# Patient Record
Sex: Male | Born: 1995
Health system: Southern US, Community
[De-identification: ages and names within clinical notes are randomized; demographics above are authoritative.]

## PROBLEM LIST (undated history)

## (undated) DIAGNOSIS — T7840XA Allergy, unspecified, initial encounter: Secondary | ICD-10-CM

## (undated) HISTORY — DX: Allergy, unspecified, initial encounter: T78.40XA

---
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
## 2014-03-30 ENCOUNTER — Ambulatory Visit: Payer: BC Managed Care – PPO | Admitting: Family Medicine

## 2014-04-08 ENCOUNTER — Ambulatory Visit (INDEPENDENT_AMBULATORY_CARE_PROVIDER_SITE_OTHER): Payer: BLUE CROSS/BLUE SHIELD | Admitting: Family Medicine

## 2014-04-08 ENCOUNTER — Encounter: Payer: Self-pay | Admitting: Family Medicine

## 2014-04-08 VITALS — BP 117/67 | HR 52 | Temp 97.6°F | Ht 70.5 in | Wt 163.5 lb

## 2014-04-08 DIAGNOSIS — J309 Allergic rhinitis, unspecified: Secondary | ICD-10-CM

## 2014-04-08 DIAGNOSIS — Z0000 Encounter for general adult medical examination without abnormal findings: Secondary | ICD-10-CM | POA: Insufficient documentation

## 2014-04-08 DIAGNOSIS — Z113 Encounter for screening for infections with a predominantly sexual mode of transmission: Secondary | ICD-10-CM

## 2014-04-08 LAB — LIPID PANEL
CHOL/HDL RATIO: 4
Cholesterol: 164 mg/dL (ref 0–200)
HDL: 45.8 mg/dL (ref >39.00)
LDL CALC: 96 mg/dL (ref 0–99)
NonHDL: 118.2
TRIGLYCERIDES: 109 mg/dL (ref 0.0–149.0)
VLDL: 21.8 mg/dL (ref 0.0–40.0)

## 2014-04-08 LAB — COMPREHENSIVE METABOLIC PANEL
ALBUMIN: 4.5 g/dL (ref 3.5–5.2)
ALK PHOS: 33 U/L — AB (ref 39–117)
ALT: 11 U/L (ref 0–53)
AST: 16 U/L (ref 0–37)
BILIRUBIN TOTAL: 0.5 mg/dL (ref 0.2–1.2)
BUN: 11 mg/dL (ref 6–23)
CO2: 31 meq/L (ref 19–32)
Calcium: 9.9 mg/dL (ref 8.4–10.5)
Chloride: 102 mEq/L (ref 96–112)
Creatinine, Ser: 0.97 mg/dL (ref 0.40–1.50)
GFR: 126.89 mL/min (ref >60.00)
GLUCOSE: 85 mg/dL (ref 70–99)
POTASSIUM: 3.7 meq/L (ref 3.5–5.1)
SODIUM: 138 meq/L (ref 135–145)
TOTAL PROTEIN: 7.6 g/dL (ref 6.0–8.3)

## 2014-04-08 NOTE — Progress Notes (Signed)
   Subjective:   Patient ID: Lance Mann, male    DOB: 1995/10/09, [age] y.o.   MRN: [id number]  Lance PrudeCaleb J Asato is a pleasant 19 y.o. year old male who presents to clinic today with Establish Care  on 04/08/2014  HPI:  Allergic rhinitis- mainly seasonal.  Takes prn zyrtec and or allegra. Currently asymptomatic.  Denies any respiratory complaints.  Was attending [employer] but now plans on going to the [employer]. Taking his entrance exam next month.  Physically active.  Exercises with his brother who is a Systems analystpersonal trainer.  Sexually active, but not currently.  Admits to not always using condoms. Denies any dysuria or penile discharge.  No current outpatient prescriptions on file prior to visit.   No current facility-administered medications on file prior to visit.    No Known Allergies  Past Medical History  Diagnosis Date  . Allergy     History reviewed. No pertinent past surgical history.  Family History  Problem Relation Age of Onset  . Hypertension Father     History   Social History  . Marital Status: Single    Spouse Name: N/A  . Number of Children: N/A  . Years of Education: N/A   Occupational History  . Not on file.   Social History Main Topics  . Smoking status: Never Smoker   . Smokeless tobacco: Never Used  . Alcohol Use: No  . Drug Use: No  . Sexual Activity: Not on file   Other Topics Concern  . Not on file   Social History Narrative   The PMH, PSH, Social History, Family History, Medications, and allergies [facility], and have been updated if relevant.   Review of Systems  Constitutional: Negative.   HENT: Negative.   Eyes: Negative.   Respiratory: Negative.   Cardiovascular: Negative.   Gastrointestinal: Negative.   Endocrine: Negative.   Genitourinary: Negative.   Musculoskeletal: Negative.   Skin: Negative.   Allergic/Immunologic: Negative.   Neurological: Negative.   Hematological: Negative.     Psychiatric/Behavioral: Negative.   All other systems reviewed and are negative.      Objective:    BP 117/67 mmHg  Pulse 52  Temp(Src) 97.6 F (36.4 C) (Oral)  Ht 5' 10.5" (1.791 m)  Wt 163 lb 8 oz (74.163 kg)  BMI 23.12 kg/m2   Physical Exam  Constitutional: He is oriented to person, place, and time. He appears well-developed and well-nourished. No distress.  HENT:  Head: Normocephalic and atraumatic.  Eyes: Conjunctivae are normal. Pupils are equal, round, and reactive to light.  Neck: Normal range of motion. Neck supple.  Cardiovascular: Normal rate, regular rhythm and normal heart sounds.   Pulmonary/Chest: Effort normal and breath sounds normal. No respiratory distress. He has no wheezes.  Abdominal: Soft.  Musculoskeletal: Normal range of motion. He exhibits no edema.  Neurological: He is alert and oriented to person, place, and time. No cranial nerve deficit.  Skin: Skin is warm and dry.  Psychiatric: He has a normal mood and affect. His behavior is normal. Judgment and thought content normal.  Nursing note and vitals reviewed.          Assessment & Plan:   Visit for well man health check - Plan: Comprehensive metabolic panel, Lipid panel  Screening for STD (sexually transmitted disease) - Plan: HIV antibody (with reflex), RPR No Follow-up on file.

## 2014-04-08 NOTE — Assessment & Plan Note (Signed)
Discussed dangers of smoking, alcohol, and drug abuse.  Also discussed sexual activity, including STD risk.  Encouraged to continue regular exercise.  Orders Placed This Encounter  Procedures  . Comprehensive metabolic panel  . Lipid panel  . HIV antibody (with reflex)  . RPR   Declined influenza vaccine.

## 2014-04-08 NOTE — Patient Instructions (Signed)
Nice to meet you. We will call you with your lab results.  Good luck with your [employer] Exam.

## 2014-04-09 ENCOUNTER — Encounter: Payer: Self-pay | Admitting: *Deleted

## 2014-04-09 LAB — RPR

## 2014-04-09 LAB — HIV ANTIBODY (ROUTINE TESTING W REFLEX): HIV 1&2 Ab, 4th Generation: NONREACTIVE

## 2014-04-13 ENCOUNTER — Ambulatory Visit: Payer: BLUE CROSS/BLUE SHIELD | Admitting: Family Medicine

## 2014-07-31 ENCOUNTER — Telehealth: Payer: Self-pay | Admitting: Family Medicine

## 2014-07-31 ENCOUNTER — Ambulatory Visit: Payer: BLUE CROSS/BLUE SHIELD | Admitting: Internal Medicine

## 2014-07-31 DIAGNOSIS — Z0289 Encounter for other administrative examinations: Secondary | ICD-10-CM

## 2014-07-31 NOTE — Telephone Encounter (Signed)
Patient did not come in for their appointment today for neck, low back pain.  Please let me know if patient needs to be contacted immediately for follow up or no follow up needed.

## 2014-08-03 NOTE — Telephone Encounter (Signed)
No follow up needed

## 2015-12-14 ENCOUNTER — Encounter: Payer: BLUE CROSS/BLUE SHIELD | Admitting: Family Medicine

## 2015-12-14 DIAGNOSIS — Z0289 Encounter for other administrative examinations: Secondary | ICD-10-CM

## 2015-12-22 ENCOUNTER — Encounter: Payer: Self-pay | Admitting: Family Medicine

## 2015-12-22 ENCOUNTER — Ambulatory Visit (INDEPENDENT_AMBULATORY_CARE_PROVIDER_SITE_OTHER): Payer: BLUE CROSS/BLUE SHIELD | Admitting: Family Medicine

## 2015-12-22 VITALS — BP 114/70 | HR 56 | Temp 98.2°F | Ht 70.25 in | Wt 152.2 lb

## 2015-12-22 DIAGNOSIS — Z0001 Encounter for general adult medical examination with abnormal findings: Secondary | ICD-10-CM

## 2015-12-22 DIAGNOSIS — Z113 Encounter for screening for infections with a predominantly sexual mode of transmission: Secondary | ICD-10-CM | POA: Diagnosis not present

## 2015-12-22 DIAGNOSIS — L308 Other specified dermatitis: Secondary | ICD-10-CM | POA: Diagnosis not present

## 2015-12-22 DIAGNOSIS — L309 Dermatitis, unspecified: Secondary | ICD-10-CM | POA: Insufficient documentation

## 2015-12-22 DIAGNOSIS — Z0000 Encounter for general adult medical examination without abnormal findings: Secondary | ICD-10-CM

## 2015-12-22 DIAGNOSIS — J301 Allergic rhinitis due to pollen: Secondary | ICD-10-CM

## 2015-12-22 LAB — COMPREHENSIVE METABOLIC PANEL
ALT: 12 U/L (ref 0–53)
AST: 17 U/L (ref 0–37)
Albumin: 4.7 g/dL (ref 3.5–5.2)
Alkaline Phosphatase: 27 U/L — ABNORMAL LOW (ref 39–117)
BUN: 11 mg/dL (ref 6–23)
CHLORIDE: 104 meq/L (ref 96–112)
CO2: 31 meq/L (ref 19–32)
Calcium: 10.1 mg/dL (ref 8.4–10.5)
Creatinine, Ser: 1.04 mg/dL (ref 0.40–1.50)
GFR: 115.16 mL/min (ref >60.00)
GLUCOSE: 67 mg/dL — AB (ref 70–99)
POTASSIUM: 3.9 meq/L (ref 3.5–5.1)
SODIUM: 141 meq/L (ref 135–145)
TOTAL PROTEIN: 7.9 g/dL (ref 6.0–8.3)
Total Bilirubin: 1.1 mg/dL (ref 0.2–1.2)

## 2015-12-22 LAB — LIPID PANEL
CHOL/HDL RATIO: 4
Cholesterol: 169 mg/dL (ref 0–200)
HDL: 43.7 mg/dL (ref >39.00)
LDL CALC: 101 mg/dL — AB (ref 0–99)
NONHDL: 125.73
Triglycerides: 126 mg/dL (ref 0.0–149.0)
VLDL: 25.2 mg/dL (ref 0.0–40.0)

## 2015-12-22 MED ORDER — TRIAMCINOLONE ACETONIDE 0.025 % EX LOTN: TOPICAL_LOTION | CUTANEOUS | 0 refills | Status: DC

## 2015-12-22 NOTE — Assessment & Plan Note (Signed)
eRx sent for triamcinlone lotion to use twice daily for no more than 10 days. If persists, refer to derm. The patient indicates understanding of these issues and agrees with the plan.

## 2015-12-22 NOTE — Assessment & Plan Note (Signed)
Reviewed preventive care protocols, scheduled due services, and updated immunizations Discussed nutrition, exercise, diet, and healthy lifestyle.  Orders Placed This Encounter  Procedures  . GC/Chlamydia Probe Amp    Order Specific Question:   Source    Answer:   urine  . Comprehensive metabolic panel  . Lipid panel  . HIV antibody (with reflex)  . RPR

## 2015-12-22 NOTE — Progress Notes (Signed)
Pre visit review using our clinic review tool, if applicable. No additional management support is needed unless otherwise documented below in the visit note. 

## 2015-12-22 NOTE — Patient Instructions (Signed)
Great to see you. We will call you with your results from today. 

## 2015-12-22 NOTE — Progress Notes (Signed)
Subjective:   Patient ID: Lance Mann, male    DOB: Dec 25, 1995, 20 y.o.   MRN: [id number]  Lance Mann is a pleasant 20 y.o. year old male who presents to clinic today with Annual Exam and Eczema (face)  on 12/22/2015  HPI:  Overall doing well.  Concerned about eczema on his face.  Dry hypopigmented patches on his face.  Has had this in past but never on both sides of his face.  Has tried OTC hydrocortisone with some improvement.  He is in school now.  Sexually active, uses condoms.   Lab Results  Component Value Date   CHOL 164 04/08/2014   HDL 45.80 04/08/2014   LDLCALC 96 04/08/2014   TRIG 109.0 04/08/2014   CHOLHDL 4 04/08/2014   Lab Results  Component Value Date   CREATININE 0.97 04/08/2014   No results found for: WBC, HGB, HCT, MCV, PLT Lab Results  Component Value Date   ALT 11 04/08/2014   AST 16 04/08/2014   ALKPHOS 33 (L) 04/08/2014   BILITOT 0.5 04/08/2014   Lab Results  Component Value Date   NA 138 04/08/2014   K 3.7 04/08/2014   CL 102 04/08/2014   CO2 31 04/08/2014     Current Outpatient Prescriptions on File Prior to Visit  Medication Sig Dispense Refill  . cetirizine (ZYRTEC) 10 MG tablet Take 10 mg by mouth daily.     No current facility-administered medications on file prior to visit.     No Known Allergies  Past Medical History:  Diagnosis Date  . Allergy     No past surgical history on file.  Family History  Problem Relation Age of Onset  . Hypertension Father     Social History   Social History  . Marital status: Single    Spouse name: N/A  . Number of children: N/A  . Years of education: N/A   Occupational History  . Not on file.   Social History Main Topics  . Smoking status: Never Smoker  . Smokeless tobacco: Never Used  . Alcohol use No  . Drug use: No  . Sexual activity: Not on file   Other Topics Concern  . Not on file   Social History Narrative  . No narrative on file   The PMH, PSH,  Social History, Family History, Medications, and allergies [facility], and have been updated if relevant.   Review of Systems  Constitutional: Negative.   HENT: Negative.   Eyes: Negative.   Respiratory: Negative.   Cardiovascular: Negative.   Gastrointestinal: Negative.   Endocrine: Negative.   Genitourinary: Negative.   Musculoskeletal: Negative.   Skin: Positive for rash.  Allergic/Immunologic: Negative.   Neurological: Negative.   Hematological: Negative.   Psychiatric/Behavioral: Negative.   All other systems reviewed and are negative.      Objective:    BP 114/70   Pulse (!) 56   Temp 98.2 F (36.8 C) (Oral)   Ht 5' 10.25" (1.784 m)   Wt 152 lb 4 oz (69.1 kg)   SpO2 98%   BMI 21.69 kg/m    Physical Exam  Constitutional: He is oriented to person, place, and time. He appears well-developed and well-nourished. No distress.  HENT:  Head: Normocephalic and atraumatic.  Eyes: Conjunctivae are normal.  Neck: Normal range of motion. Neck supple.  Cardiovascular: Normal rate and regular rhythm.   Pulmonary/Chest: Effort normal and breath sounds normal.  Abdominal: Soft. Bowel sounds are  normal. He exhibits no distension. There is no tenderness. There is no rebound.  Neurological: He is alert and oriented to person, place, and time. No cranial nerve deficit.  Skin: He is not diaphoretic.  Dry patches on his cheeks, hypopigemented  Psychiatric: He has a normal mood and affect. His behavior is normal. Judgment and thought content normal.  Nursing note reviewed.         Assessment & Plan:   Visit for well man health check - Plan: Lipid panel  Chronic allergic rhinitis due to pollen, unspecified seasonality  Screening examination for STD (sexually transmitted disease) - Plan: Comprehensive metabolic panel, HIV antibody (with reflex), RPR, GC/Chlamydia Probe Amp, CANCELED: GC/chlamydia probe amp, urine No Follow-up on file.

## 2015-12-23 LAB — GC/CHLAMYDIA PROBE AMP
CT Probe RNA: NOT DETECTED
GC PROBE AMP APTIMA: NOT DETECTED

## 2015-12-23 LAB — RPR

## 2015-12-23 LAB — HIV ANTIBODY (ROUTINE TESTING W REFLEX): HIV 1&2 Ab, 4th Generation: NONREACTIVE

## 2016-07-23 ENCOUNTER — Ambulatory Visit (HOSPITAL_COMMUNITY)
Admission: EM | Admit: 2016-07-23 | Discharge: 2016-07-23 | Disposition: A | Discharge status: Home / Self Care | Payer: BLUE CROSS/BLUE SHIELD | Attending: Family Medicine | Admitting: Family Medicine

## 2016-07-23 ENCOUNTER — Encounter (HOSPITAL_COMMUNITY): Payer: Self-pay | Admitting: *Deleted

## 2016-07-23 DIAGNOSIS — R509 Fever, unspecified: Secondary | ICD-10-CM | POA: Diagnosis not present

## 2016-07-23 DIAGNOSIS — R300 Dysuria: Secondary | ICD-10-CM | POA: Insufficient documentation

## 2016-07-23 DIAGNOSIS — Z202 Contact with and (suspected) exposure to infections with a predominantly sexual mode of transmission: Secondary | ICD-10-CM | POA: Insufficient documentation

## 2016-07-23 DIAGNOSIS — Z79899 Other long term (current) drug therapy: Secondary | ICD-10-CM | POA: Diagnosis not present

## 2016-07-23 DIAGNOSIS — H1032 Unspecified acute conjunctivitis, left eye: Secondary | ICD-10-CM | POA: Insufficient documentation

## 2016-07-23 LAB — POCT URINALYSIS DIP (DEVICE)
BILIRUBIN URINE: NEGATIVE
Glucose, UA: NEGATIVE mg/dL
Hgb urine dipstick: NEGATIVE
KETONES UR: NEGATIVE mg/dL
Nitrite: NEGATIVE
PH: 7.5 (ref 5.0–8.0)
Protein, ur: NEGATIVE mg/dL
Specific Gravity, Urine: 1.015 (ref 1.005–1.030)
Urobilinogen, UA: 0.2 mg/dL (ref 0.0–1.0)

## 2016-07-23 MED ORDER — OFLOXACIN 0.3 % OP SOLN: 1.0000 [drp] | Freq: Four times a day (QID) | OPHTHALMIC | 0 refills | Status: AC

## 2016-07-23 MED ORDER — CEFTRIAXONE SODIUM 250 MG IJ SOLR
250.0000 mg | Freq: Once | INTRAMUSCULAR | Status: AC
  Administered 2016-07-23: 250 mg via INTRAMUSCULAR

## 2016-07-23 MED ORDER — CEFTRIAXONE SODIUM 250 MG IJ SOLR
INTRAMUSCULAR | Status: AC
  Filled 2016-07-23: qty 250

## 2016-07-23 MED ORDER — IBUPROFEN 400 MG PO TABS: 400.0000 mg | ORAL_TABLET | Freq: Three times a day (TID) | ORAL | 0 refills | Status: DC | PRN

## 2016-07-23 NOTE — Discharge Instructions (Signed)
It was nice seeing you today. I have given you antibiotic injection today for presumed Gonorrhea treatment. Also antibiotic for your eyes. Use Ibuprofen as needed for fever. Please see PCP tomorrow for follow up. If symptoms worsens go to the ED.

## 2016-07-23 NOTE — ED Provider Notes (Addendum)
[facility]    CSN: [id number] Arrival date & time: 07/23/16  1947     History   Chief Complaint Chief Complaint  Patient presents with  . Fever    HPI Lance Mann is a 21 y.o. male.   The history is provided by the patient. No language interpreter was used.  Fever  Max temp prior to arrival:  103 Temp source:  Axillary Duration:  3 days Timing:  Intermittent Progression:  Waxing and waning Chronicity:  New Relieved by:  Acetaminophen Worsened by:  Nothing Associated symptoms: dysuria   Associated symptoms: no chest pain, no congestion, no cough, no diarrhea, no nausea, no rash, no sore throat and no vomiting   Dysuria  This is a new problem. The current episode started more than 1 week ago (he [facility] the [facility] and was tested for STD 6 days ago. He got treated pills for Chlamydia without a shot). The problem has not changed since onset.Pertinent negatives include no chest pain, no abdominal pain and no shortness of breath. Associated symptoms comments: Denies penile discharge. He had penile discharge prior to treatment with chlamydia meds. No hx of STD exposure recently. Last sexual intercourse was few days ago. He did not use condom. Feels some bump in his groin area. Nothing aggravates the symptoms. Nothing relieves the symptoms. He has tried nothing for the symptoms.  Conjunctivitis  This is a new (Left red eye) problem. Episode onset: 6 days ago. Pertinent negatives include no chest pain, no abdominal pain and no shortness of breath. Nothing aggravates the symptoms. Nothing relieves the symptoms. He has tried nothing for the symptoms.    Past Medical History:  Diagnosis Date  . Allergy     Patient Active Problem List   Diagnosis Date Noted  . Eczema 12/22/2015  . Visit for well man health check 04/08/2014  . Allergic rhinitis 04/08/2014    History reviewed. No pertinent surgical history.     Home Medications    Prior to  Admission medications   Medication Sig Start Date End Date Taking? Authorizing Provider  cetirizine (ZYRTEC) 10 MG tablet Take 10 mg by mouth daily.    [provider]  Triamcinolone Acetonide 0.025 % LOTN Apply twice daily to area 12/22/15   Dianne Dun, MD    Family History Family History  Problem Relation Age of Onset  . Hypertension Father     Social History Social History  Substance Use Topics  . Smoking status: Never Smoker  . Smokeless tobacco: Never Used  . Alcohol use No     Allergies   Patient has no known allergies.   Review of Systems Review of Systems  Constitutional: Positive for fever.  HENT: Negative for congestion and sore throat.   Eyes: Positive for discharge and redness. Negative for pain, itching and visual disturbance.  Respiratory: Negative for cough and shortness of breath.   Cardiovascular: Negative for chest pain.  Gastrointestinal: Negative for abdominal pain, diarrhea, nausea and vomiting.  Genitourinary: Positive for dysuria. Negative for discharge, penile swelling and scrotal swelling.  Skin: Negative for rash.  Neurological: Negative.   All other systems reviewed and are negative.    Physical Exam Triage Vital Signs ED Triage Vitals [07/23/16 2021]  Enc Vitals Group     BP 125/80     Pulse Rate 82     Resp 16     Temp (!) 101.1 F (38.4 C)     Temp Source Oral  SpO2 98 %     Weight      Height      Head Circumference      Peak Flow      Pain Score      Pain Loc      Pain Edu?      Excl. in GC?    No data found.   Updated Vital Signs BP 125/80 (BP Location: Left Arm)   Pulse 82   Temp (!) 101.1 F (38.4 C) (Oral) Comment: notified rn  Resp 16   SpO2 98%   Visual Acuity Right Eye Distance:   Left Eye Distance:   Bilateral Distance:    Right Eye Near:   Left Eye Near:    Bilateral Near:     Physical Exam  Constitutional: He is oriented to person, place, and time. He appears well-developed. No  distress.  HENT:  Right Ear: Tympanic membrane normal.  Left Ear: Tympanic membrane normal.  Eyes: EOM are normal. Pupils are equal, round, and reactive to light. Right eye exhibits no chemosis, no discharge, no exudate and no hordeolum. Left eye exhibits no chemosis, no discharge and no exudate. Right conjunctiva is not injected. Right conjunctiva has no hemorrhage. Left conjunctiva is injected.  Visual acuity by finger counting normal B/L and with right eye closed.  Cardiovascular: Normal rate, regular rhythm, normal heart sounds and intact distal pulses.   No murmur heard. Pulmonary/Chest: Effort normal and breath sounds normal. No respiratory distress. He has no wheezes. He exhibits no tenderness.  Abdominal: Soft. He exhibits no distension and no mass. There is no splenomegaly or hepatomegaly. There is no tenderness.  Musculoskeletal: Normal range of motion. He exhibits no edema.  Lymphadenopathy:    He has no axillary adenopathy.       Right: Inguinal adenopathy present.       Left: Inguinal adenopathy present.  Neurological: He is alert and oriented to person, place, and time.  Nursing note and vitals reviewed.    UC Treatments / Results  Labs (all labs ordered are listed, but only abnormal results are displayed) Labs Reviewed  POCT URINALYSIS DIP (DEVICE) - Abnormal; Notable for the following:       Result Value   Leukocytes, UA SMALL (*)    All other components within normal limits  URINE CYTOLOGY ANCILLARY ONLY    EKG  EKG Interpretation None       Radiology No results found.  Procedures Procedures (including critical care time)  Medications Ordered in UC Medications - No data to display   Initial Impression / Assessment and Plan / UC Course  I have reviewed the triage vital signs and the nursing notes.  Pertinent labs & imaging results that were available during my care of the patient were reviewed by me and considered in my medical decision making (see  chart for details).  Fever, unspecified  Acute conjunctivitis of left eye, unspecified acute conjunctivitis type  Dysuria  STD exposure    Clinical Course as of Jul 24 925  Sun Jul 23, 2016  2101 Fever. Etiology unclear. ?? Viral infection vs bacterial. He does not appear septic. He is clinically stable. Ibuprofen prescribed prn fever. Hydration recommended. ED advised if no improvement. He agreed with plan.  [KE]  2102 Dysuria. UA looks okay however I sent it for culture. I will contact him with culture result.  [KE]  2102 STD exposure. Per patient he was given oral A/B likely Zithromax for Chlamydia. IM Ceftriaxone given  today for presumed Gonorrhea infection. Urine cytology for chlamydia and gonorrhea obtained today. I will contact him with result. He is advised to follow up with PCP tomorrow for reassessment. He has inguinal LN which could be related to previous GU infection/STD LN is non-tender. Reassessment in 1-2 days with PCP recommended  [KE]  2104 Conjunctivitis. Likely bacterial. Less likely gonorrheal conjunctivitis based on appearance. However he already got Ceftriaxone shot. I also prescribed ophthalmic A/B. Return precaution discussed.  [KE]  Mon Jul 24, 2016  7829 I called to follow-up with patient. His fever has subsided. Red eye improved only a bit. I advised him to get HIV test and RPR done with his PCP soon since it was not done yesterday. Also consider lymphogranuloma venereum given inguinal LN although he denies penile lesion.  He stated he has follow-up with his PCP this afternoon and he will discuss with them.  [KE]    Clinical Course User Index [KE] Doreene Eland, MD   Urinalysis    Component Value Date/Time   LABSPEC 1.015 07/23/2016 2035   PHURINE 7.5 07/23/2016 2035   GLUCOSEU NEGATIVE 07/23/2016 2035   HGBUR NEGATIVE 07/23/2016 2035   BILIRUBINUR NEGATIVE 07/23/2016 2035   KETONESUR NEGATIVE 07/23/2016 2035   PROTEINUR  NEGATIVE 07/23/2016 2035   UROBILINOGEN 0.2 07/23/2016 2035   NITRITE NEGATIVE 07/23/2016 2035   LEUKOCYTESUR SMALL (A) 07/23/2016 2035       Final Clinical Impressions(s) / UC Diagnoses   Final diagnoses:  None    New Prescriptions New Prescriptions   No medications on file     Doreene Eland, MD 07/23/16 2108    Doreene Eland, MD 07/24/16 [phone]

## 2016-07-23 NOTE — ED Triage Notes (Signed)
pT  REPORTS  FEVER   FOR   SEVERAL  DAYS   AS   WELL  AS  A  RED  DRAINING  L   EYE    HE   STATES  HE   WAS   SEEN    ABOUT  1   WEEK  AGO   FOR  A  STD  AND  GIVEN PILLS  HE  STATES  SIME  TENDERNESS  IN   INGUINAL  AREA   AS   WELL  AS   SOME  BURNING  WHEN HE  URINATES

## 2016-07-24 ENCOUNTER — Ambulatory Visit (INDEPENDENT_AMBULATORY_CARE_PROVIDER_SITE_OTHER): Payer: BLUE CROSS/BLUE SHIELD | Admitting: Family Medicine

## 2016-07-24 ENCOUNTER — Encounter: Payer: Self-pay | Admitting: Family Medicine

## 2016-07-24 DIAGNOSIS — R590 Localized enlarged lymph nodes: Secondary | ICD-10-CM | POA: Insufficient documentation

## 2016-07-24 DIAGNOSIS — Z202 Contact with and (suspected) exposure to infections with a predominantly sexual mode of transmission: Secondary | ICD-10-CM | POA: Insufficient documentation

## 2016-07-24 DIAGNOSIS — H109 Unspecified conjunctivitis: Secondary | ICD-10-CM | POA: Insufficient documentation

## 2016-07-24 LAB — URINE CYTOLOGY ANCILLARY ONLY
Chlamydia: NEGATIVE
NEISSERIA GONORRHEA: NEGATIVE
TRICH (WINDOWPATH): NEGATIVE

## 2016-07-24 MED ORDER — DOXYCYCLINE HYCLATE 100 MG PO TABS: 100.0000 mg | ORAL_TABLET | Freq: Two times a day (BID) | ORAL | 0 refills | Status: DC

## 2016-07-24 MED ORDER — POLYMYXIN B-TRIMETHOPRIM 10000-0.1 UNIT/ML-% OP SOLN: 1.0000 [drp] | OPHTHALMIC | 0 refills | Status: DC

## 2016-07-24 NOTE — Progress Notes (Signed)
SUBJECTIVE:  Lance Mann is a 21 y.o. male here for UC follow up.  [facility].  Notes reviewed.  Had several complaints-  1. Dysuria- had been treated for chlamydia the week prior [facility] the [facility]. Denied penile discharge. UA pos for small LE, culture and cytology still pending.   2.  Conjunctivitis- 6 days of eye irritation and discharge.  Was given IM CTX in UC as he had recently received zithromax at the health department for chlamydia. Also given ocuflox eye drops.  Enlarged inguinal nodes noted on exam.  Still notes these enlarged lymph nodes, discomfort when he urinates but no penile discharge.  Also still feels like he is spiking temps.  Review of Systems - Genito-Urinary ROS: positive for - dysuria  + fever +chills +adenopathy No nausea or vomiting No genital lesions No rashes    Current Outpatient Prescriptions on File Prior to Visit  Medication Sig Dispense Refill  . cetirizine (ZYRTEC) 10 MG tablet Take 10 mg by mouth daily.    Marland Kitchen. ibuprofen (ADVIL,MOTRIN) 400 MG tablet Take 1 tablet (400 mg total) by mouth every 8 (eight) hours as needed. 30 tablet 0  . ofloxacin (OCUFLOX) 0.3 % ophthalmic solution Place 1 drop into the left eye 4 (four) times daily. 5 mL 0  . Triamcinolone Acetonide 0.025 % LOTN Apply twice daily to area 1 Bottle 0   No current facility-administered medications on file prior to visit.     No Known Allergies  Past Medical History:  Diagnosis Date  . Allergy     No past surgical history on file.  Family History  Problem Relation Age of Onset  . Hypertension Father     Social History   Social History  . Marital status: Single    Spouse name: N/A  . Number of children: N/A  . Years of education: N/A   Occupational History  . Not on file.   Social History Main Topics  . Smoking status: Never Smoker  . Smokeless tobacco: Never Used  . Alcohol use No  . Drug use: No   . Sexual activity: Not on file   Other Topics Concern  . Not on file   Social History Narrative  . No narrative on file   The PMH, PSH, Social History, Family History, Medications, and allergies [facility], and have been updated if relevant.  OBJECTIVE: BP 120/80   Pulse 75   Temp 98.3 F (36.8 C)   Wt 152 lb (68.9 kg)   SpO2 99%   BMI 21.65 kg/m    Physical Exam  Constitutional: He is well-developed, well-nourished, and in no distress. No distress.  HENT:  Head: Normocephalic and atraumatic.  Eyes: Conjunctivae are normal.  Cardiovascular: Normal rate.   Pulmonary/Chest: Effort normal.  Lymphadenopathy:       Right: Inguinal adenopathy present.       Left: Inguinal adenopathy present.  Skin: Skin is warm and dry. He is not diaphoretic.  Psychiatric: Mood, memory, affect and judgment normal.  Nursing note and vitals reviewed.

## 2016-07-24 NOTE — Assessment & Plan Note (Signed)
Complicated by recent chlamydia infection. Will check HIV and RPR today. Add 10 day course of doxycyline to broaden coverage. Awaiting additional results from the health department and [facility]. Call or return to clinic prn if these symptoms worsen or fail to improve as anticipated. The patient indicates understanding of these issues and agrees with the plan.

## 2016-07-24 NOTE — Progress Notes (Signed)
Pre visit review using our clinic review tool, if applicable. No additional management support is needed unless otherwise documented below in the visit note. 

## 2016-07-24 NOTE — Patient Instructions (Signed)
Take doxycycline as directed- 1 tablet twice daily x 10 days.  Start the polytrim eye drops as well.

## 2016-07-25 LAB — RPR

## 2016-07-25 LAB — URINE CULTURE: CULTURE: NO GROWTH

## 2016-07-25 LAB — HIV ANTIBODY (ROUTINE TESTING W REFLEX): HIV 1&2 Ab, 4th Generation: NONREACTIVE

## 2016-07-26 ENCOUNTER — Telehealth: Payer: Self-pay | Admitting: Family Medicine

## 2016-07-26 NOTE — Telephone Encounter (Signed)
Let's give the antibiotics another day.  Have him follow up with one of my partners on Friday if symptoms are not continuing to improve.

## 2016-07-26 NOTE — Telephone Encounter (Signed)
Pt was notified of results and instructions, pt verbalized understanding.  He said he's eye still red he said he's been using the eye drops you prescribed. His inguinal nodes aren't swollen anymore, but he said that he has trouble urinating "he said he has to open up the urethra in order for him to urinate".

## 2016-07-26 NOTE — Telephone Encounter (Signed)
Pt called to get lab results. He is requesting a cb.

## 2016-07-27 NOTE — Telephone Encounter (Signed)
Pt was notified of instructions, pt verbalized understanding.   

## 2016-11-20 ENCOUNTER — Ambulatory Visit: Payer: BLUE CROSS/BLUE SHIELD | Admitting: Family Medicine

## 2016-11-20 ENCOUNTER — Ambulatory Visit (INDEPENDENT_AMBULATORY_CARE_PROVIDER_SITE_OTHER): Payer: BLUE CROSS/BLUE SHIELD | Admitting: Family Medicine

## 2016-11-20 VITALS — BP 128/54 | HR 54 | Temp 98.3°F | Wt 163.0 lb

## 2016-11-20 DIAGNOSIS — J029 Acute pharyngitis, unspecified: Secondary | ICD-10-CM

## 2016-11-20 LAB — POCT RAPID STREP A (OFFICE): RAPID STREP A SCREEN: NEGATIVE

## 2016-11-20 MED ORDER — TRIAMCINOLONE ACETONIDE 0.025 % EX LOTN
TOPICAL_LOTION | CUTANEOUS | 0 refills | Status: DC
Start: 2016-11-20 — End: 2017-03-19

## 2016-11-20 NOTE — Progress Notes (Signed)
SUBJECTIVE: 21 y.o. male with sore throat, myalgias, swollen glands, headache and fever for 2 days. No history of rheumatic fever. Other symptoms: congestion and sneezing.  Current Outpatient Prescriptions on File Prior to Visit  Medication Sig Dispense Refill  . cetirizine (ZYRTEC) 10 MG tablet Take 10 mg by mouth daily.    Marland Kitchen ibuprofen (ADVIL,MOTRIN) 400 MG tablet Take 1 tablet (400 mg total) by mouth every 8 (eight) hours as needed. 30 tablet 0   No current facility-administered medications on file prior to visit.     No Known Allergies  Past Medical History:  Diagnosis Date  . Allergy     No past surgical history on file.  Family History  Problem Relation Age of Onset  . Hypertension Father     Social History   Social History  . Marital status: Single    Spouse name: N/A  . Number of children: N/A  . Years of education: N/A   Occupational History  . Not on file.   Social History Main Topics  . Smoking status: Never Smoker  . Smokeless tobacco: Never Used  . Alcohol use No  . Drug use: No  . Sexual activity: Not on file   Other Topics Concern  . Not on file   Social History Narrative  . No narrative on file   The PMH, PSH, Social History, Family History, Medications, and allergies have been reviewed in [facility], and have been updated if relevant.  OBJECTIVE:  BP (!) 128/54   Pulse (!) 54   Temp 98.3 F (36.8 C) (Oral)   Wt 163 lb (73.9 kg)   SpO2 97%   BMI 23.22 kg/m   Vitals as noted above. Appears alert, well appearing, and in no distress. Ears: bilateral TM's and external ear canals normal Oropharynx: erythematous Neck: supple, no significant adenopathy Lungs: clear to auscultation, no wheezes, rales or rhonchi, symmetric air entry Rapid Strep test is negative  ASSESSMENT: Viral pharyngitis  PLAN: Per orders. Gargle, use acetaminophen or other OTC analgesic, and take Rx fully as prescribed. Call if other family members develop similar symptoms.  See prn.

## 2017-02-26 ENCOUNTER — Ambulatory Visit: Payer: BLUE CROSS/BLUE SHIELD | Admitting: Family Medicine

## 2017-02-26 DIAGNOSIS — Z0289 Encounter for other administrative examinations: Secondary | ICD-10-CM

## 2017-03-19 ENCOUNTER — Ambulatory Visit: Payer: BLUE CROSS/BLUE SHIELD | Admitting: Family Medicine

## 2017-03-19 ENCOUNTER — Encounter: Payer: Self-pay | Admitting: Family Medicine

## 2017-03-19 ENCOUNTER — Other Ambulatory Visit (HOSPITAL_COMMUNITY)
Admission: RE | Admit: 2017-03-19 | Discharge: 2017-03-19 | Disposition: A | Discharge status: Home / Self Care | Payer: BLUE CROSS/BLUE SHIELD | Source: Ambulatory Visit | Attending: Family Medicine | Admitting: Family Medicine

## 2017-03-19 DIAGNOSIS — Z113 Encounter for screening for infections with a predominantly sexual mode of transmission: Secondary | ICD-10-CM | POA: Insufficient documentation

## 2017-03-19 DIAGNOSIS — F419 Anxiety disorder, unspecified: Secondary | ICD-10-CM | POA: Diagnosis not present

## 2017-03-19 MED ORDER — SERTRALINE HCL 25 MG PO TABS: 25.0000 mg | ORAL_TABLET | Freq: Every day | ORAL | 3 refills | Status: DC

## 2017-03-19 NOTE — Patient Instructions (Signed)
Great to see you. I will call you with your lab results from today and you can view them online.   We are starting zoloft 25 mg daily. Please update me in a few weeks.  Try a heating pad and Ibuprofen up to 800 mg three times daily with food.

## 2017-03-19 NOTE — Progress Notes (Signed)
Subjective:   Patient ID: Lance Mann, male    DOB: [birth year], 22 y.o.   MRN: [id number]  Lance PrudeCaleb J Mann is a pleasant 22 y.o. year old male who presents to clinic today with Follow-up (Patient is here today for STD testing.  No known exposure to STD but just to be certain.) and Anxiety (He also states that he has a major problem with anxiety. Plz see GAD-7 questionnaire.)  on 03/19/2017  HPI:  STD screening- no known exposure but he would like to be tested. Admits to having sex without protection at times.  Anxiety- feels he has a "major problem with anxiety." Past 6 months, feels anxious in social and work situations.  Has never felt this way before.  Actually gets short of breath and his heart races.  His parents are going through a divorce too.  He thinks maybe this has impacted him more than he knows. Denies feeling depressed.    Current Outpatient Medications on File Prior to Visit  Medication Sig Dispense Refill  . cetirizine (ZYRTEC) 10 MG tablet Take 10 mg by mouth daily.    Marland Kitchen. ibuprofen (ADVIL,MOTRIN) 400 MG tablet Take 1 tablet (400 mg total) by mouth every 8 (eight) hours as needed. 30 tablet 0   No current facility-administered medications on file prior to visit.     No Known Allergies  Past Medical History:  Diagnosis Date  . Allergy     No past surgical history on file.  Family History  Problem Relation Age of Onset  . Hypertension Father     Social History   Socioeconomic History  . Marital status: Single    Spouse name: Not on file  . Number of children: Not on file  . Years of education: Not on file  . Highest education level: Not on file  Social Needs  . Financial resource strain: Not on file  . Food insecurity - worry: Not on file  . Food insecurity - inability: Not on file  . Transportation needs - medical: Not on file  . Transportation needs - non-medical: Not on file  Occupational History  . Not on file  Tobacco Use  . Smoking  status: Never Smoker  . Smokeless tobacco: Never Used  Substance and Sexual Activity  . Alcohol use: No    Alcohol/week: 0.0 oz  . Drug use: No  . Sexual activity: Not on file  Other Topics Concern  . Not on file  Social History Narrative  . Not on file   The PMH, PSH, Social History, Family History, Medications, and allergies [facility], and have been updated if relevant.   Review of Systems  Genitourinary: Negative.   Psychiatric/Behavioral: Positive for decreased concentration. Negative for agitation, behavioral problems, confusion, dysphoric mood, hallucinations, self-injury, sleep disturbance and suicidal ideas. The patient is nervous/anxious. The patient is not hyperactive.   All other systems reviewed and are negative.      Objective:    BP 118/64 (BP Location: Left Arm, Patient Position: Sitting, Cuff Size: Normal)   Pulse 61   Temp 99.2 F (37.3 C) (Oral)   Ht 5\' 11"  (1.803 m)   Wt 166 lb (75.3 kg)   SpO2 96%   BMI 23.15 kg/m    Physical Exam  Constitutional: He is oriented to person, place, and time. He appears well-developed and well-nourished. No distress.  HENT:  Head: Normocephalic and atraumatic.  Eyes: Conjunctivae are normal.  Cardiovascular: Normal rate.  Pulmonary/Chest: Effort  normal.  Musculoskeletal: Normal range of motion.  Neurological: He is alert and oriented to person, place, and time.  Skin: Skin is warm and dry. He is not diaphoretic.  Psychiatric: He has a normal mood and affect. His behavior is normal. Judgment and thought content normal.  Nursing note and vitals reviewed.         Assessment & Plan:   Screening examination for STD (sexually transmitted disease) - Plan: HIV antibody (with reflex), RPR, Urine cytology ancillary only, CANCELED: GC probe amplification, urine  Anxiety No Follow-up on file.

## 2017-03-19 NOTE — Assessment & Plan Note (Signed)
STD screening today. Also discussed sexual activity risk, and STD risk.

## 2017-03-19 NOTE — Assessment & Plan Note (Signed)
Discussed tx options. He would like to defer psychotherapy at this time. Start Zoloft 25 mg. We discussed possible side effects of headache, GI upset, drowsiness, and SI/HI. If thoughts of SI/HI develop, we discussed to present to the emergency immediately. Patient verbalized understanding.

## 2017-03-20 LAB — HIV ANTIBODY (ROUTINE TESTING W REFLEX): HIV: NONREACTIVE

## 2017-03-20 LAB — URINE CYTOLOGY ANCILLARY ONLY
Chlamydia: NEGATIVE
Neisseria Gonorrhea: NEGATIVE
Trichomonas: NEGATIVE

## 2017-03-20 LAB — RPR: RPR Ser Ql: NONREACTIVE

## 2017-04-12 ENCOUNTER — Telehealth: Payer: Self-pay

## 2017-04-12 DIAGNOSIS — F419 Anxiety disorder, unspecified: Secondary | ICD-10-CM

## 2017-04-12 NOTE — Telephone Encounter (Signed)
Okay for new referral?   Copied from CRM [phone]. Topic: Referral - Request >> Apr 11, 2017  3:05 PM Windy KalataMichael, Taylor L, NT wrote: Patient mother [name] is calling and states that the patient seen Dr. Dayton MartesAron 03/19/17 and he would like to proceed with his psychiatrist. She states another referral will need to be placed.

## 2017-04-12 NOTE — Telephone Encounter (Signed)
Yes ok to place a new referral.

## 2017-04-12 NOTE — Telephone Encounter (Signed)
Referral entered  

## 2017-04-23 ENCOUNTER — Ambulatory Visit: Payer: BLUE CROSS/BLUE SHIELD | Admitting: Psychology

## 2017-08-23 ENCOUNTER — Telehealth: Payer: Self-pay

## 2017-08-23 NOTE — Telephone Encounter (Signed)
Okay for patient to transfer from Dr. Dayton MartesAron to Dr. Doreene BurkeKremer?     Copied from CRM [phone]#[id number]. Topic: General - Other >> Aug 23, 2017 11:42 AM Windy KalataMichael, Taylor L, NT wrote: Reason for CRM: patient would like to switch to care to Dr. Doreene BurkeKremer from Dr. Dayton MartesAron. Nothing personal, the patient would rather see a male and his brother sees Dr. Doreene BurkeKremer so he would like to switch. Please contact patient when approved.

## 2017-08-23 NOTE — Telephone Encounter (Signed)
Okay to schedule patient. 

## 2017-08-23 NOTE — Telephone Encounter (Signed)
Okay 

## 2017-08-23 NOTE — Telephone Encounter (Signed)
Yes okay with me.

## 2017-08-24 ENCOUNTER — Encounter: Payer: Self-pay | Admitting: Family Medicine

## 2017-08-27 ENCOUNTER — Encounter: Payer: Self-pay | Admitting: Family Medicine

## 2017-08-29 ENCOUNTER — Encounter: Payer: BLUE CROSS/BLUE SHIELD | Admitting: Family Medicine

## 2017-08-30 ENCOUNTER — Encounter: Payer: Self-pay | Admitting: Family Medicine

## 2017-08-31 ENCOUNTER — Ambulatory Visit: Payer: BLUE CROSS/BLUE SHIELD | Admitting: Family Medicine

## 2017-08-31 ENCOUNTER — Encounter: Payer: Self-pay | Admitting: Family Medicine

## 2017-08-31 VITALS — BP 120/80 | HR 51 | Ht 71.0 in | Wt 169.5 lb

## 2017-08-31 DIAGNOSIS — T887XXA Unspecified adverse effect of drug or medicament, initial encounter: Secondary | ICD-10-CM | POA: Diagnosis not present

## 2017-08-31 LAB — TSH: TSH: 1.26 u[IU]/mL (ref 0.35–4.50)

## 2017-08-31 LAB — CBC
HEMATOCRIT: 41 % (ref 39.0–52.0)
Hemoglobin: 13.9 g/dL (ref 13.0–17.0)
MCHC: 34 g/dL (ref 30.0–36.0)
MCV: 92.9 fl (ref 78.0–100.0)
Platelets: 244 10*3/uL (ref 150.0–400.0)
RBC: 4.41 Mil/uL (ref 4.22–5.81)
RDW: 13.6 % (ref 11.5–15.5)
WBC: 3.9 10*3/uL — ABNORMAL LOW (ref 4.0–10.5)

## 2017-08-31 NOTE — Patient Instructions (Signed)
Drug Allergy A drug allergy is when your body reacts in a bad way to a medicine. This can be life-threatening. If you have an allergic reaction, get help right away, even if the reaction seems gentle (mild). Your doctor may teach you how to use an allergy kit (anaphylaxis kit) and how to give yourself an allergy shot (epinephrine injection). You can give yourself an allergy shot with what is commonly called an auto-injector "pen." Symptoms of a Gentle Reaction  A stuffy nose (nasal congestion).  Tingling in your mouth.  An itchy, red rash. Symptoms of a Very Bad Reaction  Swelling of your eyes, lips, face, or tongue.  Swelling of the back of your mouth and your throat.  Breathing loudly (wheezing).  A hoarse voice.  Itchy, red, swollen areas of skin (hives).  Dizziness or light-headedness.  Passing out (fainting).  Feeling worried or nervous (anxiety).  Feeling confused.  Pain in your belly (abdomen).  Trouble with breathing, talking, or swallowing.  A tight feeling in your chest.  Fast or uneven heartbeats (palpitations).  Throwing up (vomiting).  Watery poop (diarrhea). Follow these instructions at home: If You Have a Very Bad Allergy:  Always keep an auto-injector pen or your allergy kit with you. These could save your life. Use them as told by your doctor.  Make sure that you, the people who live with you, and your employer know: ? How to use your allergy kit. ? How to use an auto-injector pen to give you an allergy shot.  If you used your auto-injector pen: ? Get more medicine for it right away. This is important in case you have another reaction. ? Get help right away.  Wear a bracelet or necklace that says you have an allergy, if your doctor tells you to do this. General instructions  Avoid medicines that you are allergic to.  Take over-the-counter and prescription medicines only as told by your doctor.  Do not drive until your doctor says it is  safe.  If you have itchy, red, swollen areas of skin or a rash: ? Use over-the-counter medicine (antihistamine) as told by your doctor. ? Put cold, wet cloths (cold compresses) on your skin. ? Take baths or showers in cool water. Avoid hot water.  If you had tests done, it is your responsibility to get your test results. Ask your doctor when your results will be ready.  Tell any doctors who care for you that you have a drug allergy.  Keep all follow-up visits as told by your doctor. This is important. Contact a doctor if:  You start to have any of these: ? A stuffy nose. ? Tingling in your mouth. ? An itchy, red rash.  You have symptoms that last more than 2 days after your reaction.  Your symptoms get worse.  You get new symptoms. Get help right away if:  You had to use your auto-injector pen. You must go to the emergency room even if the medicine seems to be working.  You have any of these: ? Swelling in your eyes, lips, face, or tongue. ? Swelling in the back of your mouth or your throat. ? Loud breathing. ? A hoarse voice. ? Itchy, red, swollen areas of skin. ? Trouble with breathing, talking, or swallowing. ? A tight feeling in your chest. ? A fast heartbeat.  You have throwing up that gets very bad.  You have watery poop that gets very bad.  You feel dizzy or light-headed.  You  pass out. These symptoms may be an emergency. Do not wait to see if the symptoms will go away. Use your auto-injector pen or allergy kit as you have been told. Get medical help right away. Call your local emergency services (911 in the [place] Do not drive yourself to the hospital. This information is not intended to replace advice given to you by your health care provider. Make sure you discuss any questions you have with your health care provider. Document Released: 03/09/2004 Document Revised: 07/08/2015 Document Reviewed: 09/01/2014 Elsevier Interactive Patient Education  [employer].

## 2017-08-31 NOTE — Progress Notes (Signed)
Subjective:  Patient ID: Lance Mann, male    DOB: Dec 14, 1995  Age: 22 y.o. MRN: [id number]  CC: Establish Care   HPI Lance Mann presents for evaluation of an asymptomatic rash that is appeared on his face and neck and posterior hands, specifically the distal fingers.  Rash has not been tender sore or pruritic.  With this he is also experienced hot flashes that emanates from his chest into his extremities.  He denied fever chills stuffy nose drainage cough fatigue malaise chest pain or nausea vomiting.  He believes that this began after he started a biotin supplement for his skin and hair.  Outpatient Medications Prior to Visit  Medication Sig Dispense Refill  . cetirizine (ZYRTEC) 10 MG tablet Take 10 mg by mouth daily.    Marland Kitchen. ibuprofen (ADVIL,MOTRIN) 400 MG tablet Take 1 tablet (400 mg total) by mouth every 8 (eight) hours as needed. 30 tablet 0  . sertraline (ZOLOFT) 25 MG tablet Take 1 tablet (25 mg total) by mouth daily. 30 tablet 3   No facility-administered medications prior to visit.     ROS Review of Systems  Constitutional: Negative for chills, diaphoresis, fatigue, fever and unexpected weight change.  HENT: Negative.   Eyes: Negative for photophobia and visual disturbance.  Respiratory: Negative for cough, chest tightness and wheezing.   Cardiovascular: Negative.   Gastrointestinal: Negative.   Endocrine: Negative for cold intolerance and heat intolerance.  Genitourinary: Negative.   Musculoskeletal: Negative for arthralgias and myalgias.  Skin: Positive for rash. Negative for color change, pallor and wound.  Allergic/Immunologic: Negative for immunocompromised state.  Neurological: Negative.   Hematological: Does not bruise/bleed easily.  Psychiatric/Behavioral: Negative.     Objective:  BP 120/80   Pulse (!) 51   Ht 5\' 11"  (1.803 m)   Wt 169 lb 8 oz (76.9 kg)   SpO2 99%   BMI 23.64 kg/m   BP Readings from Last 3 Encounters:  08/31/17 120/80    03/19/17 118/64  11/20/16 (!) 128/54    Wt Readings from Last 3 Encounters:  08/31/17 169 lb 8 oz (76.9 kg)  03/19/17 166 lb (75.3 kg)  11/20/16 163 lb (73.9 kg)    Physical Exam  Constitutional: He is oriented to person, place, and time. He appears well-developed and well-nourished. No distress.  HENT:  Head: Normocephalic and atraumatic.  Right Ear: External ear normal.  Left Ear: External ear normal.  Mouth/Throat: Oropharynx is clear and moist. No oropharyngeal exudate.  Eyes: Pupils are equal, round, and reactive to light. Conjunctivae and EOM are normal. Right eye exhibits no discharge. Left eye exhibits no discharge. No scleral icterus.  Neck: Normal range of motion. Neck supple. No JVD present. No tracheal deviation present. No thyromegaly present.  Cardiovascular: Normal rate, regular rhythm and normal heart sounds.  Pulmonary/Chest: Effort normal and breath sounds normal.  Abdominal: Bowel sounds are normal.  Musculoskeletal: Normal range of motion.  Lymphadenopathy:    He has no cervical adenopathy.  Neurological: He is alert and oriented to person, place, and time.  Skin: Skin is warm and dry. No rash noted. He is not diaphoretic. No erythema. No pallor.  Psychiatric: He has a normal mood and affect. His behavior is normal.    Lab Results  Component Value Date   GLUCOSE 67 (L) 12/22/2015   CHOL 169 12/22/2015   TRIG 126.0 12/22/2015   HDL 43.70 12/22/2015   LDLCALC 101 (H) 12/22/2015   ALT 12 12/22/2015   AST  17 12/22/2015   NA 141 12/22/2015   K 3.9 12/22/2015   CL 104 12/22/2015   CREATININE 1.04 12/22/2015   BUN 11 12/22/2015   CO2 31 12/22/2015    No results found.  Assessment & Plan:   Waris was seen today for establish care.  Diagnoses and all orders for this visit:  Medication side effect -     TSH -     CBC   I have discontinued Cornie J. Deleeuw's ibuprofen and sertraline. I am also having him maintain his cetirizine.  No orders of  the defined types were placed in this encounter.  [employer]ufact[employer] biotin does not report any adverse side effects with it.  Laboratory review did show an association for suppressed thyroid function on biotin.  Will check TSH and CBC today.  Patient's symptoms have mostly resolved status post discontinuation of it.  Follow-up: Return if symptoms worsen or fail to improve.  Mliss Sax, MD

## 2017-09-03 ENCOUNTER — Encounter: Payer: Self-pay | Admitting: Family Medicine

## 2017-09-17 ENCOUNTER — Encounter: Payer: Self-pay | Admitting: Family Medicine

## 2017-09-17 ENCOUNTER — Ambulatory Visit: Payer: BLUE CROSS/BLUE SHIELD | Admitting: Family Medicine

## 2017-09-17 VITALS — BP 126/64 | HR 57 | Temp 98.3°F | Ht 71.0 in | Wt 170.2 lb

## 2017-09-17 DIAGNOSIS — L720 Epidermal cyst: Secondary | ICD-10-CM | POA: Insufficient documentation

## 2017-09-17 DIAGNOSIS — S39012A Strain of muscle, fascia and tendon of lower back, initial encounter: Secondary | ICD-10-CM | POA: Diagnosis not present

## 2017-09-17 MED ORDER — MUPIROCIN 2 % EX OINT: TOPICAL_OINTMENT | CUTANEOUS | 0 refills | Status: DC

## 2017-09-17 MED ORDER — CYCLOBENZAPRINE HCL 10 MG PO TABS: 10.0000 mg | ORAL_TABLET | Freq: Every day | ORAL | 0 refills | Status: DC

## 2017-09-17 NOTE — Patient Instructions (Signed)
Back Injury Prevention °Back injuries can be very painful. They can also be difficult to heal. After having one back injury, you are more likely to injure your back again. It is important to learn how to avoid injuring or re-injuring your back. The following tips can help you to prevent a back injury. °What should I know about physical fitness? °· Exercise for 30 minutes per day on most days of the week or as directed by your health care provider. Make sure to: °? Do aerobic exercises, such as walking, jogging, biking, or swimming. °? Do exercises that increase balance and strength, such as tai chi and yoga. These can decrease your risk of falling and injuring your back. °? Do stretching exercises to help with flexibility. °? Try to develop strong abdominal muscles. Your abdominal muscles provide a lot of the support that is needed by your back. °· Maintain a healthy weight. This helps to decrease your risk of a back injury. °What should I know about my diet? °· Talk with your health care provider about your overall diet. Take supplements and vitamins only as directed by your health care provider. °· Talk with your health care provider about how much calcium and vitamin D you need each day. These nutrients help to prevent weakening of the bones (osteoporosis). Osteoporosis can cause broken (fractured) bones, which lead to back pain. °· Include good sources of calcium in your diet, such as dairy products, green leafy vegetables, and products that have had calcium added to them (fortified). °· Include good sources of vitamin D in your diet, such as milk and foods that are fortified with vitamin D. °What should I know about my posture? °· Sit up straight and stand up straight. Avoid leaning forward when you sit or hunching over when you stand. °· Choose chairs that have good low-back (lumbar) support. °· If you work at a desk, sit close to it so you do not need to lean over. Keep your chin tucked in. Keep your neck  drawn back, and keep your elbows bent at a right angle. Your arms should look like the letter "L." °· Sit high and close to the steering wheel when you drive. Add a lumbar support to your car seat, if needed. °· Avoid sitting or standing in one position for very long. Take breaks to get up, stretch, and walk around at least one time every hour. Take breaks every hour if you are driving for long periods of time. °· Sleep on your side with your knees slightly bent, or sleep on your back with a pillow under your knees. Do not lie on the front of your body to sleep. °What should I know about lifting, twisting, and reaching? °Lifting and Heavy Lifting ° °· Avoid heavy lifting, especially repetitive heavy lifting. If you must do heavy lifting: °? Stretch before lifting. °? Work slowly. °? Rest between lifts. °? Use a tool such as a cart or a dolly to move objects if one is available. °? Make several small trips instead of carrying one heavy load. °? Ask for help when you need it, especially when moving big objects. °· Follow these steps when lifting: °? Stand with your feet shoulder-width apart. °? Get as close to the object as you can. Do not try to pick up a heavy object that is far from your body. °? Use handles or lifting straps if they are available. °? Bend at your knees. Squat down, but keep your heels off the   floor. ? Keep your shoulders pulled back, your chin tucked in, and your back straight. ? Lift the object slowly while you tighten the muscles in your legs, abdomen, and buttocks. Keep the object as close to the center of your body as possible.  Follow these steps when putting down a heavy load: ? Stand with your feet shoulder-width apart. ? Lower the object slowly while you tighten the muscles in your legs, abdomen, and buttocks. Keep the object as close to the center of your body as possible. ? Keep your shoulders pulled back, your chin tucked in, and your back straight. ? Bend at your knees. Squat  down, but keep your heels off the floor. ? Use handles or lifting straps if they are available. Twisting and Reaching  Avoid lifting heavy objects above your waist.  Do not twist at your waist while you are lifting or carrying a load. If you need to turn, move your feet.  Do not bend over without bending at your knees.  Avoid reaching over your head, across a table, or for an object on a high surface. What are some other tips?  Avoid wet floors and icy ground. Keep sidewalks clear of ice to prevent falls.  Do not sleep on a mattress that is too soft or too hard.  Keep items that are used frequently within easy reach.  Put heavier objects on shelves at waist level, and put lighter objects on lower or higher shelves.  Find ways to decrease your stress, such as exercise, massage, or relaxation techniques. Stress can build up in your muscles. Tense muscles are more vulnerable to injury.  Talk with your health care provider if you feel anxious or depressed. These conditions can make back pain worse.  Wear flat heel shoes with cushioned soles.  Avoid sudden movements.  Use both shoulder straps when carrying a backpack.  Do not use any tobacco products, including cigarettes, chewing tobacco, or electronic cigarettes. If you need help quitting, ask your health care provider. This information is not intended to replace advice given to you by your health care provider. Make sure you discuss any questions you have with your health care provider. Document Released: 03/09/2004 Document Revised: 07/08/2015 Document Reviewed: 02/03/2014 Elsevier Interactive Patient Education  2018 [employer].  Back Exercises If you have pain in your back, do these exercises 2-3 times each day or as told by your doctor. When the pain goes away, do the exercises once each day, but repeat the steps more times for each exercise (do more repetitions). If you do not have pain in your back, do these exercises once  each day or as told by your doctor. Exercises Single Knee to Chest  Do these steps 3-5 times in a row for each leg: 1. Lie on your back on a firm bed or the floor with your legs stretched out. 2. Bring one knee to your chest. 3. Hold your knee to your chest by grabbing your knee or thigh. 4. Pull on your knee until you feel a gentle stretch in your lower back. 5. Keep doing the stretch for 10-30 seconds. 6. Slowly let go of your leg and straighten it.  Pelvic Tilt  Do these steps 5-10 times in a row: 1. Lie on your back on a firm bed or the floor with your legs stretched out. 2. Bend your knees so they point up to the ceiling. Your feet should be flat on the floor. 3. Tighten your lower belly (abdomen)  muscles to press your lower back against the floor. This will make your tailbone point up to the ceiling instead of pointing down to your feet or the floor. 4. Stay in this position for 5-10 seconds while you gently tighten your muscles and breathe evenly.  Cat-Cow  Do these steps until your lower back bends more easily: 1. Get on your hands and knees on a firm surface. Keep your hands under your shoulders, and keep your knees under your hips. You may put padding under your knees. 2. Let your head hang down, and make your tailbone point down to the floor so your lower back is round like the back of a cat. 3. Stay in this position for 5 seconds. 4. Slowly lift your head and make your tailbone point up to the ceiling so your back hangs low (sags) like the back of a cow. 5. Stay in this position for 5 seconds.  Press-Ups  Do these steps 5-10 times in a row: 1. Lie on your belly (face-down) on the floor. 2. Place your hands near your head, about shoulder-width apart. 3. While you keep your back relaxed and keep your hips on the floor, slowly straighten your arms to raise the top half of your body and lift your shoulders. Do not use your back muscles. To make yourself more comfortable, you  may change where you place your hands. 4. Stay in this position for 5 seconds. 5. Slowly return to lying flat on the floor.  Bridges  Do these steps 10 times in a row: 1. Lie on your back on a firm surface. 2. Bend your knees so they point up to the ceiling. Your feet should be flat on the floor. 3. Tighten your butt muscles and lift your butt off of the floor until your waist is almost as high as your knees. If you do not feel the muscles working in your butt and the back of your thighs, slide your feet 1-2 inches farther away from your butt. 4. Stay in this position for 3-5 seconds. 5. Slowly lower your butt to the floor, and let your butt muscles relax.  If this exercise is too easy, try doing it with your arms crossed over your chest. Belly Crunches  Do these steps 5-10 times in a row: 1. Lie on your back on a firm bed or the floor with your legs stretched out. 2. Bend your knees so they point up to the ceiling. Your feet should be flat on the floor. 3. Cross your arms over your chest. 4. Tip your chin a little bit toward your chest but do not bend your neck. 5. Tighten your belly muscles and slowly raise your chest just enough to lift your shoulder blades a tiny bit off of the floor. 6. Slowly lower your chest and your head to the floor.  Back Lifts Do these steps 5-10 times in a row: 1. Lie on your belly (face-down) with your arms at your sides, and rest your forehead on the floor. 2. Tighten the muscles in your legs and your butt. 3. Slowly lift your chest off of the floor while you keep your hips on the floor. Keep the back of your head in line with the curve in your back. Look at the floor while you do this. 4. Stay in this position for 3-5 seconds. 5. Slowly lower your chest and your face to the floor.  Contact a doctor if:  Your back pain gets a lot worse  when you do an exercise.  Your back pain does not lessen 2 hours after you exercise. If you have any of these  problems, stop doing the exercises. Do not do them again unless your doctor says it is okay. Get help right away if:  You have sudden, very bad back pain. If this happens, stop doing the exercises. Do not do them again unless your doctor says it is okay. This information is not intended to replace advice given to you by your health care provider. Make sure you discuss any questions you have with your health care provider. Document Released: 03/04/2010 Document Revised: 07/08/2015 Document Reviewed: 03/26/2014 Elsevier Interactive Patient Education  [employer].

## 2017-09-17 NOTE — Progress Notes (Signed)
Subjective:  Patient ID: Lance Mann, male    DOB: 1995/03/14  Age: 22 y.o. MRN: [id number]  CC: Back Pain (lower back pain , painful with movement 6/10 , 4/10 w/o movement)   HPI Lance Mann presents for evaluation of lower back pain for the last 4 days.  Pain is located in the left lower back area and does not radiate.  Lance Mann does feel the muscles tighten on occasion.  There is been no numbness tingling or weakness.  No radiation of pain down into the leg.  Lance Mann has not been taking any medicines for this.  Lance Mann has been treating it with flexion stretching.  Lance Mann has a recurrent pimple in Lance Mann pubic area.  Lance Mann comes to ahead releases but can reform.  There is been no dysuria or other rash in Lance Mann pubic area.  Lance Mann has been working for a [employer].  Outpatient Medications Prior to Visit  Medication Sig Dispense Refill  . cetirizine (ZYRTEC) 10 MG tablet Take 10 mg by mouth daily.     No facility-administered medications prior to visit.     ROS Review of Systems  Constitutional: Negative.   Respiratory: Negative.   Cardiovascular: Negative.   Gastrointestinal: Negative.   Genitourinary: Negative for difficulty urinating, discharge, dysuria, frequency and penile swelling.  Musculoskeletal: Positive for back pain and myalgias. Negative for gait problem.  Skin: Positive for color change and rash. Negative for pallor and wound.  Neurological: Negative for weakness and numbness.  Hematological: Does not bruise/bleed easily.  Psychiatric/Behavioral: Negative.     Objective:  BP 126/64   Pulse (!) 57   Temp 98.3 F (36.8 C) (Oral)   Ht 5\' 11"  (1.803 m)   Wt 170 lb 3.2 oz (77.2 kg)   SpO2 98%   BMI 23.74 kg/m   BP Readings from Last 3 Encounters:  09/17/17 126/64  08/31/17 120/80  03/19/17 118/64    Wt Readings from Last 3 Encounters:  09/17/17 170 lb 3.2 oz (77.2 kg)  08/31/17 169 lb 8 oz (76.9 kg)  03/19/17 166 lb (75.3 kg)    Physical Exam  Constitutional: Lance Mann  appears well-developed and well-nourished. No distress.  HENT:  Head: Normocephalic and atraumatic.  Right Ear: External ear normal.  Eyes: Right eye exhibits no discharge. Left eye exhibits no discharge. No scleral icterus.  Neck: No JVD present. No tracheal deviation present.  Pulmonary/Chest: Breath sounds normal.  Musculoskeletal:       Lumbar back: Lance Mann exhibits normal range of motion, no tenderness, no bony tenderness, no deformity and no spasm.  Neurological: Lance Mann is alert. Lance Mann has normal strength.  Skin: Lance Mann is not diaphoretic.       Lab Results  Component Value Date   WBC 3.9 (L) 08/31/2017   HGB 13.9 08/31/2017   HCT 41.0 08/31/2017   PLT 244.0 08/31/2017   GLUCOSE 67 (L) 12/22/2015   CHOL 169 12/22/2015   TRIG 126.0 12/22/2015   HDL 43.70 12/22/2015   LDLCALC 101 (H) 12/22/2015   ALT 12 12/22/2015   AST 17 12/22/2015   NA 141 12/22/2015   K 3.9 12/22/2015   CL 104 12/22/2015   CREATININE 1.04 12/22/2015   BUN 11 12/22/2015   CO2 31 12/22/2015   TSH 1.26 08/31/2017    No results found.  Assessment & Plan:   Lance Mann was seen today for back pain.  Diagnoses and all orders for this visit:  Strain of lumbar region, initial encounter -  cyclobenzaprine (FLEXERIL) 10 MG tablet; Take 1 tablet (10 mg total) by mouth at bedtime. As needed.  Inclusion cyst -     mupirocin ointment (BACTROBAN) 2 %; Apply  Small amount to cyst twice daily for 10 days.   I am having Lance Mann start on cyclobenzaprine and mupirocin ointment. I am also having Lance Mann maintain Lance Mann cetirizine.  Meds ordered this encounter  Medications  . cyclobenzaprine (FLEXERIL) 10 MG tablet    Sig: Take 1 tablet (10 mg total) by mouth at bedtime. As needed.    Dispense:  15 tablet    Refill:  0  . mupirocin ointment (BACTROBAN) 2 %    Sig: Apply  Small amount to cyst twice daily for 10 days.    Dispense:  15 g    Refill:  0  Demonstrated proper lifting technique and patient was already well  versed in this.  Encouraged extension exercises.  Lance Mann was given information on avoiding back injuries and rehabilitation exercises.  Lance Mann work will allow Lance Mann to avoid repetitive bending and stooping and heavy lifting.  Lance Mann was advised to take over-the-counter ibuprofen and Tylenol together 4 times a day as needed.  Flexeril was given for nighttime use.  Follow-up in 7 to 10 days if not improving.   Follow-up: Return follow up in 10 days if not improving.Mliss Sax.  Devaunte Gasparini Alfred Rayn Enderson, MD

## 2017-10-09 ENCOUNTER — Ambulatory Visit: Payer: BLUE CROSS/BLUE SHIELD | Admitting: Family Medicine

## 2017-10-09 ENCOUNTER — Encounter: Payer: Self-pay | Admitting: Family Medicine

## 2017-10-09 ENCOUNTER — Ambulatory Visit (INDEPENDENT_AMBULATORY_CARE_PROVIDER_SITE_OTHER): Payer: BLUE CROSS/BLUE SHIELD

## 2017-10-09 VITALS — BP 126/80 | HR 60 | Ht 71.0 in | Wt 168.4 lb

## 2017-10-09 DIAGNOSIS — J40 Bronchitis, not specified as acute or chronic: Secondary | ICD-10-CM | POA: Diagnosis not present

## 2017-10-09 DIAGNOSIS — L720 Epidermal cyst: Secondary | ICD-10-CM

## 2017-10-09 DIAGNOSIS — M545 Low back pain: Secondary | ICD-10-CM | POA: Diagnosis not present

## 2017-10-09 DIAGNOSIS — S39012D Strain of muscle, fascia and tendon of lower back, subsequent encounter: Secondary | ICD-10-CM | POA: Diagnosis not present

## 2017-10-09 DIAGNOSIS — F17200 Nicotine dependence, unspecified, uncomplicated: Secondary | ICD-10-CM | POA: Diagnosis not present

## 2017-10-09 DIAGNOSIS — S39012A Strain of muscle, fascia and tendon of lower back, initial encounter: Secondary | ICD-10-CM

## 2017-10-09 DIAGNOSIS — K219 Gastro-esophageal reflux disease without esophagitis: Secondary | ICD-10-CM

## 2017-10-09 MED ORDER — PANTOPRAZOLE SODIUM 40 MG PO TBEC: 40.0000 mg | DELAYED_RELEASE_TABLET | Freq: Every day | ORAL | 3 refills | Status: DC

## 2017-10-09 MED ORDER — MELOXICAM 15 MG PO TABS: 15.0000 mg | ORAL_TABLET | Freq: Every day | ORAL | 0 refills | Status: DC

## 2017-10-09 MED ORDER — CYCLOBENZAPRINE HCL 10 MG PO TABS: 10.0000 mg | ORAL_TABLET | Freq: Every day | ORAL | 0 refills | Status: DC

## 2017-10-09 MED ORDER — DOXYCYCLINE HYCLATE 100 MG PO TABS: 100.0000 mg | ORAL_TABLET | Freq: Two times a day (BID) | ORAL | 0 refills | Status: DC

## 2017-10-09 NOTE — Patient Instructions (Signed)
Back Injury Prevention °Back injuries can be very painful. They can also be difficult to heal. After having one back injury, you are more likely to injure your back again. It is important to learn how to avoid injuring or re-injuring your back. The following tips can help you to prevent a back injury. °What should I know about physical fitness? °· Exercise for 30 minutes per day on most days of the week or as directed by your health care provider. Make sure to: °? Do aerobic exercises, such as walking, jogging, biking, or swimming. °? Do exercises that increase balance and strength, such as tai chi and yoga. These can decrease your risk of falling and injuring your back. °? Do stretching exercises to help with flexibility. °? Try to develop strong abdominal muscles. Your abdominal muscles provide a lot of the support that is needed by your back. °· Maintain a healthy weight. This helps to decrease your risk of a back injury. °What should I know about my diet? °· Talk with your health care provider about your overall diet. Take supplements and vitamins only as directed by your health care provider. °· Talk with your health care provider about how much calcium and vitamin D you need each day. These nutrients help to prevent weakening of the bones (osteoporosis). Osteoporosis can cause broken (fractured) bones, which lead to back pain. °· Include good sources of calcium in your diet, such as dairy products, green leafy vegetables, and products that have had calcium added to them (fortified). °· Include good sources of vitamin D in your diet, such as milk and foods that are fortified with vitamin D. °What should I know about my posture? °· Sit up straight and stand up straight. Avoid leaning forward when you sit or hunching over when you stand. °· Choose chairs that have good low-back (lumbar) support. °· If you work at a desk, sit close to it so you do not need to lean over. Keep your chin tucked in. Keep your neck  drawn back, and keep your elbows bent at a right angle. Your arms should look like the letter "L." °· Sit high and close to the steering wheel when you drive. Add a lumbar support to your car seat, if needed. °· Avoid sitting or standing in one position for very long. Take breaks to get up, stretch, and walk around at least one time every hour. Take breaks every hour if you are driving for long periods of time. °· Sleep on your side with your knees slightly bent, or sleep on your back with a pillow under your knees. Do not lie on the front of your body to sleep. °What should I know about lifting, twisting, and reaching? °Lifting and Heavy Lifting ° °· Avoid heavy lifting, especially repetitive heavy lifting. If you must do heavy lifting: °? Stretch before lifting. °? Work slowly. °? Rest between lifts. °? Use a tool such as a cart or a dolly to move objects if one is available. °? Make several small trips instead of carrying one heavy load. °? Ask for help when you need it, especially when moving big objects. °· Follow these steps when lifting: °? Stand with your feet shoulder-width apart. °? Get as close to the object as you can. Do not try to pick up a heavy object that is far from your body. °? Use handles or lifting straps if they are available. °? Bend at your knees. Squat down, but keep your heels off the   up a heavy object that is far from your body.  ? Use handles or lifting straps if they are available.  ? Bend at your knees. Squat down, but keep your heels off the floor.  ? Keep your shoulders pulled back, your chin tucked in, and your back straight.  ? Lift the object slowly while you tighten the muscles in your legs, abdomen, and buttocks. Keep the object as close to the center of your body as possible.   Follow these steps when putting down a heavy load:  ? Stand with your feet shoulder-width apart.  ? Lower the object slowly while you tighten the muscles in your legs, abdomen, and buttocks. Keep the object as close to the center of your body as possible.  ? Keep your shoulders pulled back, your chin tucked in, and your back straight.  ? Bend at your knees. Squat  down, but keep your heels off the floor.  ? Use handles or lifting straps if they are available.  Twisting and Reaching   Avoid lifting heavy objects above your waist.   Do not twist at your waist while you are lifting or carrying a load. If you need to turn, move your feet.   Do not bend over without bending at your knees.   Avoid reaching over your head, across a table, or for an object on a high surface.  What are some other tips?   Avoid wet floors and icy ground. Keep sidewalks clear of ice to prevent falls.   Do not sleep on a mattress that is too soft or too hard.   Keep items that are used frequently within easy reach.   Put heavier objects on shelves at waist level, and put lighter objects on lower or higher shelves.   Find ways to decrease your stress, such as exercise, massage, or relaxation techniques. Stress can build up in your muscles. Tense muscles are more vulnerable to injury.   Talk with your health care provider if you feel anxious or depressed. These conditions can make back pain worse.   Wear flat heel shoes with cushioned soles.   Avoid sudden movements.   Use both shoulder straps when carrying a backpack.   Do not use any tobacco products, including cigarettes, chewing tobacco, or electronic cigarettes. If you need help quitting, ask your health care provider.  This information is not intended to replace advice given to you by your health care provider. Make sure you discuss any questions you have with your health care provider.  Document Released: 03/09/2004 Document Revised: 07/08/2015 Document Reviewed: 02/03/2014  [employer] Interactive Patient Education  2018 [employer]

## 2017-10-09 NOTE — Progress Notes (Signed)
Subjective:  Patient ID: Lance Mann, male    DOB: [birth year]  Age: 22 y.o. MRN: [id number]  CC: Back Pain (a little bit of improvement since last visit, still having pain when bending & moving around. )   HPI Lance Mann presents for follow-up of his left lower back pain and other issues.  Back is slightly improved.  Flexeril at night seem to help much but it still is bothering him.  He has not been working that much in his job at [employer].  Pain does not radiate and there is no paresthesia or weakness.  He has been having a cough for some time that is productive of yellow to clear phlegm.  He has no wheezing fever chills or postnasal drip.  He does not smoke echo he does smoke marijuana occasion.  He is tried no medicines for this the bump in his groin area continues to drain sporadically healed and then drained.  He has been applying the Bactroban ointment.  Patient tells of burning into the chest with some increase in burping there is been some dysphasia.  He has tried no medicines for this.  Outpatient Medications Prior to Visit  Medication Sig Dispense Refill  . cetirizine (ZYRTEC) 10 MG tablet Take 10 mg by mouth daily.    . mupirocin ointment (BACTROBAN) 2 % Apply  Small amount to cyst twice daily for 10 days. 15 g 0  . cyclobenzaprine (FLEXERIL) 10 MG tablet Take 1 tablet (10 mg total) by mouth at bedtime. As needed. 15 tablet 0   No facility-administered medications prior to visit.     ROS Review of Systems  Constitutional: Negative for chills, fatigue, fever and unexpected weight change.  HENT: Positive for postnasal drip. Negative for congestion, rhinorrhea, sinus pressure and sinus pain.   Eyes: Negative for photophobia and visual disturbance.  Respiratory: Positive for cough. Negative for chest tightness and shortness of breath.   Gastrointestinal: Negative for abdominal distention, abdominal pain, anal bleeding, blood in stool and vomiting.  Endocrine:  Negative for polyphagia and polyuria.  Genitourinary: Negative.   Musculoskeletal: Positive for arthralgias, back pain and myalgias.  Allergic/Immunologic: Negative for immunocompromised state.  Neurological: Negative for weakness and headaches.  Hematological: Negative.   Psychiatric/Behavioral: Negative.     Objective:  BP 126/80   Pulse 60   Ht 5\' 11"  (1.803 m)   Wt 168 lb 6 oz (76.4 kg)   SpO2 99%   BMI 23.48 kg/m   BP Readings from Last 3 Encounters:  10/09/17 126/80  09/17/17 126/64  08/31/17 120/80    Wt Readings from Last 3 Encounters:  10/09/17 168 lb 6 oz (76.4 kg)  09/17/17 170 lb 3.2 oz (77.2 kg)  08/31/17 169 lb 8 oz (76.9 kg)    Physical Exam  Constitutional: He is oriented to person, place, and time. He appears well-developed and well-nourished. No distress.  HENT:  Head: Normocephalic and atraumatic.  Right Ear: External ear normal.  Left Ear: External ear normal.  Mouth/Throat: Oropharynx is clear and moist. No oropharyngeal exudate.  Eyes: Pupils are equal, round, and reactive to light. Conjunctivae and EOM are normal. Right eye exhibits no discharge. Left eye exhibits no discharge. No scleral icterus.  Neck: Neck supple. No JVD present. No tracheal deviation present. No thyromegaly present.  Cardiovascular: Normal rate, regular rhythm and normal heart sounds.  Pulmonary/Chest: Effort normal and breath sounds normal. No stridor. No respiratory distress. He has no wheezes. He has no  rales.  Abdominal: Soft. Bowel sounds are normal. He exhibits no distension and no mass. There is no tenderness. There is no rebound and no guarding.  Musculoskeletal:       Lumbar back: He exhibits normal range of motion, no tenderness, no bony tenderness and no spasm.       Back:  Lymphadenopathy:    He has no cervical adenopathy.  Neurological: He is alert and oriented to person, place, and time.  Skin: Skin is warm and dry. Rash noted. Rash is pustular.       Lab  Results  Component Value Date   WBC 3.9 (L) 08/31/2017   HGB 13.9 08/31/2017   HCT 41.0 08/31/2017   PLT 244.0 08/31/2017   GLUCOSE 67 (L) 12/22/2015   CHOL 169 12/22/2015   TRIG 126.0 12/22/2015   HDL 43.70 12/22/2015   LDLCALC 101 (H) 12/22/2015   ALT 12 12/22/2015   AST 17 12/22/2015   NA 141 12/22/2015   K 3.9 12/22/2015   CL 104 12/22/2015   CREATININE 1.04 12/22/2015   BUN 11 12/22/2015   CO2 31 12/22/2015   TSH 1.26 08/31/2017    No results found.  Assessment & Plan:   Alee was seen today for back pain.  Diagnoses and all orders for this visit:  Strain of lumbar region, subsequent encounter -     cyclobenzaprine (FLEXERIL) 10 MG tablet; Take 1 tablet (10 mg total) by mouth at bedtime. As needed. -     meloxicam (MOBIC) 15 MG tablet; Take 1 tablet (15 mg total) by mouth daily. For 2 weeks with food and then as needed. -     DG Lumbar Spine Complete; Future -     Ambulatory referral to Physical Therapy -     DG Lumbar Spine Complete  Inclusion cyst -     doxycycline (VIBRA-TABS) 100 MG tablet; Take 1 tablet (100 mg total) by mouth 2 (two) times daily. With food.  Bronchitis -     doxycycline (VIBRA-TABS) 100 MG tablet; Take 1 tablet (100 mg total) by mouth 2 (two) times daily. With food.  Smoking  Strain of lumbar region, initial encounter -     cyclobenzaprine (FLEXERIL) 10 MG tablet; Take 1 tablet (10 mg total) by mouth at bedtime. As needed.  Gastroesophageal reflux disease, esophagitis presence not specified -     pantoprazole (PROTONIX) 40 MG tablet; Take 1 tablet (40 mg total) by mouth daily.   I am having Lance Mann start on doxycycline, meloxicam, and pantoprazole. I am also having him maintain his cetirizine, mupirocin ointment, and cyclobenzaprine.  Meds ordered this encounter  Medications  . cyclobenzaprine (FLEXERIL) 10 MG tablet    Sig: Take 1 tablet (10 mg total) by mouth at bedtime. As needed.    Dispense:  15 tablet    Refill:  0   . doxycycline (VIBRA-TABS) 100 MG tablet    Sig: Take 1 tablet (100 mg total) by mouth 2 (two) times daily. With food.    Dispense:  20 tablet    Refill:  0  . meloxicam (MOBIC) 15 MG tablet    Sig: Take 1 tablet (15 mg total) by mouth daily. For 2 weeks with food and then as needed.    Dispense:  30 tablet    Refill:  0  . pantoprazole (PROTONIX) 40 MG tablet    Sig: Take 1 tablet (40 mg total) by mouth daily.    Dispense:  30 tablet  Refill:  3   Patient requested films.  He did agree for physical therapy.  We will go ahead and try doxycycline for his cough and pustule.  Strongly encouraged him to stop smoking marijuana.  Suggested that life could go better for him with a clearer head.  Follow-up: Return in about 6 weeks (around 11/20/2017).  Mliss Sax, MD

## 2017-11-28 ENCOUNTER — Encounter: Payer: Self-pay | Admitting: Family Medicine

## 2017-11-28 ENCOUNTER — Ambulatory Visit: Payer: BLUE CROSS/BLUE SHIELD | Admitting: Family Medicine

## 2017-11-28 VITALS — BP 120/78 | HR 61 | Ht 71.0 in | Wt 167.2 lb

## 2017-11-28 DIAGNOSIS — F418 Other specified anxiety disorders: Secondary | ICD-10-CM | POA: Diagnosis not present

## 2017-11-28 MED ORDER — VENLAFAXINE HCL 37.5 MG PO TABS: ORAL_TABLET | ORAL | 0 refills | Status: DC

## 2017-11-28 NOTE — Patient Instructions (Signed)
Living With Anxiety After being diagnosed with an anxiety disorder, you may be relieved to know why you have felt or behaved a certain way. It is natural to also feel overwhelmed about the treatment ahead and what it will mean for your life. With care and support, you can manage this condition and recover from it. How to cope with anxiety Dealing with stress Stress is your body's reaction to life changes and events, both good and bad. Stress can last just a few hours or it can be ongoing. Stress can play a major role in anxiety, so it is important to learn both how to cope with stress and how to think about it differently. Talk with your health care provider or a counselor to learn more about stress reduction. He or she may suggest some stress reduction techniques, such as:  Music therapy. This can include creating or listening to music that you enjoy and that inspires you.  Mindfulness-based meditation. This involves being aware of your normal breaths, rather than trying to control your breathing. It can be done while sitting or walking.  Centering prayer. This is a kind of meditation that involves focusing on a word, phrase, or sacred image that is meaningful to you and that brings you peace.  Deep breathing. To do this, expand your stomach and inhale slowly through your nose. Hold your breath for 3-5 seconds. Then exhale slowly, allowing your stomach muscles to relax.  Self-talk. This is a skill where you identify thought patterns that lead to anxiety reactions and correct those thoughts.  Muscle relaxation. This involves tensing muscles then relaxing them.  Choose a stress reduction technique that fits your lifestyle and personality. Stress reduction techniques take time and practice. Set aside 5-15 minutes a day to do them. Therapists can offer training in these techniques. The training may be covered by some insurance plans. Other things you can do to manage stress include:  Keeping a  stress diary. This can help you learn what triggers your stress and ways to control your response.  Thinking about how you respond to certain situations. You may not be able to control everything, but you can control your reaction.  Making time for activities that help you relax, and not feeling guilty about spending your time in this way.  Therapy combined with coping and stress-reduction skills provides the best chance for successful treatment. Medicines Medicines can help ease symptoms. Medicines for anxiety include:  Anti-anxiety drugs.  Antidepressants.  Beta-blockers.  Medicines may be used as the main treatment for anxiety disorder, along with therapy, or if other treatments are not working. Medicines should be prescribed by a health care provider. Relationships Relationships can play a big part in helping you recover. Try to spend more time connecting with trusted friends and family members. Consider going to couples counseling, taking family education classes, or going to family therapy. Therapy can help you and others better understand the condition. How to recognize changes in your condition Everyone has a different response to treatment for anxiety. Recovery from anxiety happens when symptoms decrease and stop interfering with your daily activities at home or work. This may mean that you will start to:  Have better concentration and focus.  Sleep better.  Be less irritable.  Have more energy.  Have improved memory.  It is important to recognize when your condition is getting worse. Contact your health care provider if your symptoms interfere with home or work and you do not feel like your condition   is improving. Where to find help and support: You can get help and support from these sources:  Self-help groups.  Online and [employer].  A trusted spiritual leader.  Couples counseling.  Family education classes.  Family therapy.  Follow these  instructions at home:  Eat a healthy diet that includes plenty of vegetables, fruits, whole grains, low-fat dairy products, and lean protein. Do not eat a lot of foods that are high in solid fats, added sugars, or salt.  Exercise. Most adults should do the following: ? Exercise for at least 150 minutes each week. The exercise should increase your heart rate and make you sweat (moderate-intensity exercise). ? Strengthening exercises at least twice a week.  Cut down on caffeine, tobacco, alcohol, and other potentially harmful substances.  Get the right amount and quality of sleep. Most adults need 7-9 hours of sleep each night.  Make choices that simplify your life.  Take over-the-counter and prescription medicines only as told by your health care provider.  Avoid caffeine, alcohol, and certain over-the-counter cold medicines. These may make you feel worse. Ask your pharmacist which medicines to avoid.  Keep all follow-up visits as told by your health care provider. This is important. Questions to ask your health care provider  Would I benefit from therapy?  How often should I follow up with a health care provider?  How long do I need to take medicine?  Are there any long-term side effects of my medicine?  Are there any alternatives to taking medicine? Contact a health care provider if:  You have a hard time staying focused or finishing daily tasks.  You spend many hours a day feeling worried about everyday life.  You become exhausted by worry.  You start to have headaches, feel tense, or have nausea.  You urinate more than normal.  You have diarrhea. Get help right away if:  You have a racing heart and shortness of breath.  You have thoughts of hurting yourself or others. If you ever feel like you may hurt yourself or others, or have thoughts about taking your own life, get help right away. You can go to your nearest emergency department or call:  Your local emergency  services (911 in the [place]  A suicide crisis helpline, such as the National Suicide Prevention Lifeline at 901 329 7867. This is open 24-hours a day.  Summary  Taking steps to deal with stress can help calm you.  Medicines cannot cure anxiety disorders, but they can help ease symptoms.  Family, friends, and partners can play a big part in helping you recover from an anxiety disorder. This information is not intended to replace advice given to you by your health care provider. Make sure you discuss any questions you have with your health care provider. Document Released: 01/25/2016 Document Revised: 01/25/2016 Document Reviewed: 01/25/2016 Elsevier Interactive Patient Education  2018 [employer]. Venlafaxine tablets What is this medicine? VENLAFAXINE (VEN la fax een) is used to treat depression, anxiety and panic disorder. This medicine may be used for other purposes; ask your health care provider or pharmacist if you have questions. COMMON BRAND NAME(S): Effexor What should I tell my health care provider before I take this medicine? They need to know if you have any of these conditions: -bleeding disorders -glaucoma -heart disease -high blood pressure -high cholesterol -kidney disease -liver disease -low levels of sodium in the blood -mania or bipolar disorder -seizures -suicidal thoughts, plans, or attempt; a previous suicide attempt by you or a  family -take medicines that treat or prevent blood clots -thyroid disease -an unusual or allergic reaction to venlafaxine, desvenlafaxine, other medicines, foods, dyes, or preservatives -pregnant or trying to get pregnant -breast-feeding How should I use this medicine? Take this medicine by mouth with a glass of water. Follow the directions on the prescription label. Take it with food. Take your medicine at regular intervals. Do not take your medicine more often than directed. Do not stop taking this medicine suddenly except upon  the advice of your doctor. Stopping this medicine too quickly may cause serious side effects or your condition may worsen. A special MedGuide will be given to you by the pharmacist with each prescription and refill. Be sure to read this information carefully each time. Talk to your pediatrician regarding the use of this medicine in children. Special care may be needed. Overdosage: If you think you have taken too much of this medicine contact a poison control center or emergency room at once. NOTE: This medicine is only for you. Do not share this medicine with others. What if I miss a dose? If you miss a dose, take it as soon as you can. If it is almost time for your next dose, take only that dose. Do not take double or extra doses. What may interact with this medicine? Do not take this medicine with any of the following medications: -certain medicines for fungal infections like fluconazole, itraconazole, ketoconazole, posaconazole, voriconazole -cisapride -desvenlafaxine -dofetilide -dronedarone -duloxetine -levomilnacipran -linezolid -MAOIs like Carbex, Eldepryl, Marplan, Nardil, and Parnate -methylene blue (injected into a vein) -milnacipran -pimozide -thioridazine -ziprasidone This medicine may also interact with the following medications: -amphetamines -aspirin and aspirin-like medicines -certain medicines for depression, anxiety, or psychotic disturbances -certain medicines for migraine headaches like almotriptan, eletriptan, frovatriptan, naratriptan, rizatriptan, sumatriptan, zolmitriptan -certain medicines for sleep -certain medicines that treat or prevent blood clots like dalteparin, enoxaparin, warfarin -cimetidine -clozapine -diuretics -fentanyl -furazolidone -indinavir -isoniazid -lithium -metoprolol -NSAIDS, medicines for pain and inflammation, like ibuprofen or naproxen -other medicines that prolong the QT interval (cause an abnormal heart  rhythm) -procarbazine -rasagiline -supplements like St. John's wort, kava kava, valerian -tramadol -tryptophan This list may not describe all possible interactions. Give your health care provider a list of all the medicines, herbs, non-prescription drugs, or dietary supplements you use. Also tell them if you smoke, drink alcohol, or use illegal drugs. Some items may interact with your medicine. What should I watch for while using this medicine? Tell your doctor if your symptoms do not get better or if they get worse. Visit your doctor or health care professional for regular checks on your progress. Because it may take several weeks to see the full effects of this medicine, it is important to continue your treatment as prescribed by your doctor. Patients and their families should watch out for new or worsening thoughts of suicide or depression. Also watch out for sudden changes in feelings such as feeling anxious, agitated, panicky, irritable, hostile, aggressive, impulsive, severely restless, overly excited and hyperactive, or not being able to sleep. If this happens, especially at the beginning of treatment or after a change in dose, call your health care professional. This medicine can cause an increase in blood pressure. Check with your doctor for instructions on monitoring your blood pressure while taking this medicine. You may get drowsy or dizzy. Do not drive, use machinery, or do anything that needs mental alertness until you know how this medicine affects you. Do not stand or sit up quickly,  especially if you are an older patient. This reduces the risk of dizzy or fainting spells. Alcohol may interfere with the effect of this medicine. Avoid alcoholic drinks. Your mouth may get dry. Chewing sugarless gum, sucking hard candy and drinking plenty of water will help. Contact your doctor if the problem does not go away or is severe. What side effects may I notice from receiving this medicine? Side  effects that you should report to your doctor or health care professional as soon as possible: -allergic reactions like skin rash, itching or hives, swelling of the face, lips, or tongue -anxious -breathing problems -confusion -changes in vision -chest pain -confusion -elevated mood, decreased need for sleep, racing thoughts, impulsive behavior -eye pain -fast, irregular heartbeat -feeling faint or lightheaded, falls -feeling agitated, angry, or irritable -hallucination, loss of contact with reality -high blood pressure -loss of balance or coordination -palpitations -redness, blistering, peeling or loosening of the skin, including inside the mouth -restlessness, pacing, inability to keep still -seizures -stiff muscles -suicidal thoughts or other mood changes -trouble passing urine or change in the amount of urine -trouble sleeping -unusual bleeding or bruising -unusually weak or tired -vomiting Side effects that usually do not require medical attention (report to your doctor or health care professional if they continue or are bothersome): -change in sex drive or performance -change in appetite or weight -constipation -dizziness -dry mouth -headache -increased sweating -nausea -tired This list may not describe all possible side effects. Call your doctor for medical advice about side effects. You may report side effects to FDA at 1-800-FDA-1088. Where should I keep my medicine? Keep out of the reach of children. Store at a controlled temperature between 20 and 25 degrees C (68 and 77 degrees F), in a dry place. Throw away any unused medicine after the expiration date. NOTE: This sheet is a summary. It may not cover all possible information. If you have questions about this medicine, talk to your doctor, pharmacist, or health care provider.  2018 Elsevier/Gold Standard (2015-07-01 18:42:26)

## 2017-11-28 NOTE — Progress Notes (Signed)
Subjective:  Patient ID: Lance Mann, male    DOB: 28-Oct-1995  Age: 22 y.o. MRN: [id number]  CC: Anxiety   HPI Lance Mann presents for patient presents for evaluation and treatment of long-standing history of anxiety associated with sadness.  He was seen few years ago for this issue and prescribed Zoloft but elected not to take the medication.  He is sad because he feels as though he has not achieved personal goals that he had set for himself by his current age.  He also feels that he may have some PTSD but tells me it is a deeply personal issue and he is reluctant to discuss it.  This is been worse since his parents separated 3 years ago.  As far as he knows the divorce has not been finalized.  He lost his dog last week.  His cousin is currently living in the home with him and his mother.  Her mother his aunt died last week as well and her funeral was just this past Monday.  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  He has been at school at [employer] preparing for this exam.  He is no longer smoking marijuana.  He does not drink alcohol.  Outpatient Medications Prior to Visit  Medication Sig Dispense Refill  . cetirizine (ZYRTEC) 10 MG tablet Take 10 mg by mouth daily.    . pantoprazole (PROTONIX) 40 MG tablet Take 1 tablet (40 mg total) by mouth daily. 30 tablet 3  . cyclobenzaprine (FLEXERIL) 10 MG tablet Take 1 tablet (10 mg total) by mouth at bedtime. As needed. 15 tablet 0  . doxycycline (VIBRA-TABS) 100 MG tablet Take 1 tablet (100 mg total) by mouth 2 (two) times daily. With food. 20 tablet 0  . meloxicam (MOBIC) 15 MG tablet Take 1 tablet (15 mg total) by mouth daily. For 2 weeks with food and then as needed. 30 tablet 0  . mupirocin ointment (BACTROBAN) 2 % Apply  Small amount to cyst twice daily for 10 days. 15 g 0   No facility-administered medications prior to visit.     ROS Review of Systems  Constitutional:  Negative.   HENT: Negative.   Eyes: Negative.   Respiratory: Negative.   Cardiovascular: Negative.   Gastrointestinal: Negative.   Psychiatric/Behavioral: Positive for dysphoric mood. Negative for self-injury and suicidal ideas. The patient is nervous/anxious.     Objective:  BP 120/78   Pulse 61   Ht 5\' 11"  (1.803 m)   Wt 167 lb 4 oz (75.9 kg)   SpO2 99%   BMI 23.33 kg/m   BP Readings from Last 3 Encounters:  11/28/17 120/78  10/09/17 126/80  09/17/17 126/64    Wt Readings from Last 3 Encounters:  11/28/17 167 lb 4 oz (75.9 kg)  10/09/17 168 lb 6 oz (76.4 kg)  09/17/17 170 lb 3.2 oz (77.2 kg)    Physical Exam  Constitutional: He is oriented to person, place, and time. He appears well-developed and well-nourished. No distress.  HENT:  Head: Normocephalic and atraumatic.  Right Ear: External ear normal.  Left Ear: External ear normal.  Eyes: Right eye exhibits no discharge. Left eye exhibits no discharge. No scleral icterus.  Neck: No tracheal deviation present.  Pulmonary/Chest: Effort normal.  Neurological: He is alert and oriented to person, place, and time.  Skin: He is not diaphoretic.  Psychiatric: He has a normal  mood and affect. His behavior is normal.   Depression screen [facility] 2/9 11/28/2017 03/19/2017  Decreased Interest 1 0  Down, Depressed, Hopeless 0 0  PHQ - 2 Score 1 0  Altered sleeping 0 -  Tired, decreased energy 0 -  Change in appetite 0 -  Feeling bad or failure about yourself  2 -  Trouble concentrating 1 -  Moving slowly or fidgety/restless 2 -  Suicidal thoughts 0 -  PHQ-9 Score 6 -    Lab Results  Component Value Date   WBC 3.9 (L) 08/31/2017   HGB 13.9 08/31/2017   HCT 41.0 08/31/2017   PLT 244.0 08/31/2017   GLUCOSE 67 (L) 12/22/2015   CHOL 169 12/22/2015   TRIG 126.0 12/22/2015   HDL 43.70 12/22/2015   LDLCALC 101 (H) 12/22/2015   ALT 12 12/22/2015   AST 17 12/22/2015   NA 141 12/22/2015   K 3.9 12/22/2015   CL 104  12/22/2015   CREATININE 1.04 12/22/2015   BUN 11 12/22/2015   CO2 31 12/22/2015   TSH 1.26 08/31/2017    No results found.  Assessment & Plan:   Montrae was seen today for anxiety.  Diagnoses and all orders for this visit:  Depression with anxiety -     venlafaxine (EFFEXOR) 37.5 MG tablet; Take one daily for a week and then one twice daily.   I have discontinued Jontue J. Barriere's mupirocin ointment, cyclobenzaprine, doxycycline, and meloxicam. I am also having him start on venlafaxine. Additionally, I am having him maintain his cetirizine and pantoprazole.  Meds ordered this encounter  Medications  . venlafaxine (EFFEXOR) 37.5 MG tablet    Sig: Take one daily for a week and then one twice daily.    Dispense:  60 tablet    Refill:  0   Patient was given information about coping with anxiety and depression.  Was also given information about using Effexor and its side effects.  Advised and offered counseling to augment his therapy.  He does not feel as though he can afford that at this time.  He will consider it in the future.  Follow-up: Return in about 1 month (around 12/29/2017), or if symptoms worsen or fail to improve.  Mliss Sax, MD

## 2018-01-29 ENCOUNTER — Other Ambulatory Visit: Payer: Self-pay | Admitting: Family Medicine

## 2018-01-29 DIAGNOSIS — L720 Epidermal cyst: Secondary | ICD-10-CM

## 2018-01-29 DIAGNOSIS — J40 Bronchitis, not specified as acute or chronic: Secondary | ICD-10-CM

## 2018-02-07 ENCOUNTER — Encounter: Payer: Self-pay | Admitting: Family Medicine

## 2018-02-07 ENCOUNTER — Ambulatory Visit: Payer: BLUE CROSS/BLUE SHIELD | Admitting: Family Medicine

## 2018-02-07 VITALS — BP 120/70 | HR 68 | Ht 71.0 in | Wt 171.5 lb

## 2018-02-07 DIAGNOSIS — L739 Follicular disorder, unspecified: Secondary | ICD-10-CM

## 2018-02-07 MED ORDER — DOXYCYCLINE HYCLATE 100 MG PO TABS: 100.0000 mg | ORAL_TABLET | Freq: Two times a day (BID) | ORAL | 0 refills | Status: DC

## 2018-02-07 NOTE — Progress Notes (Signed)
Established Patient Office Visit  Subjective:  Patient ID: Lance Mann, male    DOB: May 01, 1995  Age: 22 y.o. MRN: [id number]  CC:  Chief Complaint  Patient presents with  . cyst on hand    HPI Lance Mann presents for follow-up of folliculitis in the pubic area.  It is recurring in the same place.  It responded to doxycycline in the past but then recurred in about 3 to 4 months.  Patient took the Effexor for 3 weeks and did not feel as though it was making any difference so he will continue it.  He is seeing a Veterinary surgeoncounselor and will see her for his second session on Monday.  Past Medical History:  Diagnosis Date  . Allergy     History reviewed. No pertinent surgical history.  Family History  Problem Relation Age of Onset  . Hypertension Father     Social History   Socioeconomic History  . Marital status: Single    Spouse name: Not on file  . Number of children: Not on file  . Years of education: Not on file  . Highest education level: Not on file  Occupational History  . Not on file  Social Needs  . Financial resource strain: Not on file  . Food insecurity:    Worry: Not on file    Inability: Not on file  . Transportation needs:    Medical: Not on file    Non-medical: Not on file  Tobacco Use  . Smoking status: Never Smoker  . Smokeless tobacco: Never Used  Substance and Sexual Activity  . Alcohol use: No    Alcohol/week: 0.0 standard drinks  . Drug use: No  . Sexual activity: Not on file  Lifestyle  . Physical activity:    Days per week: Not on file    Minutes per session: Not on file  . Stress: Not on file  Relationships  . Social connections:    Talks on phone: Not on file    Gets together: Not on file    Attends religious service: Not on file    Active member of club or organization: Not on file    Attends meetings of clubs or organizations: Not on file    Relationship status: Not on file  . Intimate partner violence:    Fear of current  or ex partner: Not on file    Emotionally abused: Not on file    Physically abused: Not on file    Forced sexual activity: Not on file  Other Topics Concern  . Not on file  Social History Narrative  . Not on file    Outpatient Medications Prior to Visit  Medication Sig Dispense Refill  . cetirizine (ZYRTEC) 10 MG tablet Take 10 mg by mouth daily.    . pantoprazole (PROTONIX) 40 MG tablet Take 1 tablet (40 mg total) by mouth daily. 30 tablet 3  . venlafaxine (EFFEXOR) 37.5 MG tablet Take one daily for a week and then one twice daily. 60 tablet 0   No facility-administered medications prior to visit.     No Known Allergies  ROS Review of Systems  Constitutional: Negative.   HENT: Negative.   Respiratory: Negative.   Cardiovascular: Negative.   Gastrointestinal: Negative.   Skin: Positive for color change and wound.  Hematological: Does not bruise/bleed easily.      Objective:    Physical Exam  Constitutional: He is oriented to person, place, and time. He appears well-developed  and well-nourished. No distress.  HENT:  Head: Normocephalic and atraumatic.  Right Ear: External ear normal.  Left Ear: External ear normal.  Eyes: Right eye exhibits no discharge. Left eye exhibits no discharge. No scleral icterus.  Pulmonary/Chest: Effort normal.  Neurological: He is alert and oriented to person, place, and time.  Skin: Skin is warm and dry. He is not diaphoretic.     Psychiatric: He has a normal mood and affect. His behavior is normal.    BP 120/70   Pulse 68   Ht 5\' 11"  (1.803 m)   Wt 171 lb 8 oz (77.8 kg)   SpO2 96%   BMI 23.92 kg/m  Wt Readings from Last 3 Encounters:  02/07/18 171 lb 8 oz (77.8 kg)  11/28/17 167 lb 4 oz (75.9 kg)  10/09/17 168 lb 6 oz (76.4 kg)   BP Readings from Last 3 Encounters:  02/07/18 120/70  11/28/17 120/78  10/09/17 126/80   Guideline developer:  UpToDate (see UpToDate for funding source) Date Released: June 2014  Health  Maintenance Due  Topic Date Due  . Janet BerlinETANUS/TDAP  06/17/2015  . INFLUENZA VACCINE  09/13/2017    There are no preventive care reminders to display for this patient.  Lab Results  Component Value Date   TSH 1.26 08/31/2017   Lab Results  Component Value Date   WBC 3.9 (L) 08/31/2017   HGB 13.9 08/31/2017   HCT 41.0 08/31/2017   MCV 92.9 08/31/2017   PLT 244.0 08/31/2017   Lab Results  Component Value Date   NA 141 12/22/2015   K 3.9 12/22/2015   CO2 31 12/22/2015   GLUCOSE 67 (L) 12/22/2015   BUN 11 12/22/2015   CREATININE 1.04 12/22/2015   BILITOT 1.1 12/22/2015   ALKPHOS 27 (L) 12/22/2015   AST 17 12/22/2015   ALT 12 12/22/2015   PROT 7.9 12/22/2015   ALBUMIN 4.7 12/22/2015   CALCIUM 10.1 12/22/2015   GFR 115.16 12/22/2015   Lab Results  Component Value Date   CHOL 169 12/22/2015   Lab Results  Component Value Date   HDL 43.70 12/22/2015   Lab Results  Component Value Date   LDLCALC 101 (H) 12/22/2015   Lab Results  Component Value Date   TRIG 126.0 12/22/2015   Lab Results  Component Value Date   CHOLHDL 4 12/22/2015   No results found for: HGBA1C    Assessment & Plan:   Problem List Items Addressed This Visit      Musculoskeletal and Integument   Folliculitis - Primary   Relevant Medications   doxycycline (VIBRA-TABS) 100 MG tablet   Other Relevant Orders   Ambulatory referral to Dermatology      Meds ordered this encounter  Medications  . doxycycline (VIBRA-TABS) 100 MG tablet    Sig: Take 1 tablet (100 mg total) by mouth 2 (two) times daily.    Dispense:  20 tablet    Refill:  0    Follow-up: No follow-ups on file.   Patient will continue counseling.  Treat for 10 days with Doxy.  Is taking this medicine before without issue.  Go ahead and ask for dermatology referral due to the recurrent nature of this problem for him.

## 2018-02-07 NOTE — Patient Instructions (Signed)

## 2018-02-22 DIAGNOSIS — F411 Generalized anxiety disorder: Secondary | ICD-10-CM | POA: Diagnosis not present

## 2018-02-26 ENCOUNTER — Ambulatory Visit (INDEPENDENT_AMBULATORY_CARE_PROVIDER_SITE_OTHER): Payer: BLUE CROSS/BLUE SHIELD | Admitting: Family Medicine

## 2018-02-26 ENCOUNTER — Encounter: Payer: Self-pay | Admitting: Family Medicine

## 2018-02-26 VITALS — BP 128/80 | HR 56 | Ht 71.0 in

## 2018-02-26 DIAGNOSIS — F418 Other specified anxiety disorders: Secondary | ICD-10-CM | POA: Diagnosis not present

## 2018-02-26 DIAGNOSIS — L739 Follicular disorder, unspecified: Secondary | ICD-10-CM

## 2018-02-26 NOTE — Progress Notes (Signed)
Established Patient Office Visit  Subjective:  Patient ID: Lance Mann, male    DOB: 1995/02/26  Age: 23 y.o. MRN: [id number]  CC:  Chief Complaint  Patient presents with  . cyst on left hand    HPI Lance Mann presents for follow-up of persistent hair follicle inflammation in his left groin area.  It is been treated with doxycycline and topical bacitracin but persists.  Patient had been referred to dermatology for this but was lost to follow-up.  Patient tells me that he did fill the Effexor prescription but only took it for couple of weeks and did not like the side effects.  He is currently seeing a counselor at [facility] who has referred him on to a psychiatrist associated with that facility.  Past Medical History:  Diagnosis Date  . Allergy     History reviewed. No pertinent surgical history.  Family History  Problem Relation Age of Onset  . Hypertension Father     Social History   Socioeconomic History  . Marital status: Single    Spouse name: Not on file  . Number of children: Not on file  . Years of education: Not on file  . Highest education level: Not on file  Occupational History  . Not on file  Social Needs  . Financial resource strain: Not on file  . Food insecurity:    Worry: Not on file    Inability: Not on file  . Transportation needs:    Medical: Not on file    Non-medical: Not on file  Tobacco Use  . Smoking status: Never Smoker  . Smokeless tobacco: Never Used  Substance and Sexual Activity  . Alcohol use: No    Alcohol/week: 0.0 standard drinks  . Drug use: No  . Sexual activity: Not on file  Lifestyle  . Physical activity:    Days per week: Not on file    Minutes per session: Not on file  . Stress: Not on file  Relationships  . Social connections:    Talks on phone: Not on file    Gets together: Not on file    Attends religious service: Not on file    Active member of club or organization: Not on file    Attends meetings of  clubs or organizations: Not on file    Relationship status: Not on file  . Intimate partner violence:    Fear of current or ex partner: Not on file    Emotionally abused: Not on file    Physically abused: Not on file    Forced sexual activity: Not on file  Other Topics Concern  . Not on file  Social History Narrative  . Not on file    Outpatient Medications Prior to Visit  Medication Sig Dispense Refill  . cetirizine (ZYRTEC) 10 MG tablet Take 10 mg by mouth daily.    . pantoprazole (PROTONIX) 40 MG tablet Take 1 tablet (40 mg total) by mouth daily. 30 tablet 3  . venlafaxine (EFFEXOR) 37.5 MG tablet Take one daily for a week and then one twice daily. 60 tablet 0  . doxycycline (VIBRA-TABS) 100 MG tablet Take 1 tablet (100 mg total) by mouth 2 (two) times daily. 20 tablet 0   No facility-administered medications prior to visit.     No Known Allergies  ROS Review of Systems  Constitutional: Negative for diaphoresis, fatigue, fever and unexpected weight change.  Respiratory: Negative.   Cardiovascular: Negative.   Gastrointestinal: Negative.  Skin: Positive for color change and wound.  Allergic/Immunologic: Negative for immunocompromised state.  Psychiatric/Behavioral: Negative for self-injury and suicidal ideas. The patient is nervous/anxious.       Objective:    Physical Exam  Constitutional: He is oriented to person, place, and time. He appears well-developed and well-nourished. No distress.  HENT:  Head: Normocephalic and atraumatic.  Right Ear: External ear normal.  Left Ear: External ear normal.  Eyes: Right eye exhibits no discharge. Left eye exhibits no discharge. No scleral icterus.  Pulmonary/Chest: Effort normal.  Neurological: He is alert and oriented to person, place, and time.  Skin: Skin is warm and dry. He is not diaphoretic.     Psychiatric: He has a normal mood and affect. His behavior is normal.    BP 128/80   Pulse (!) 56   Ht 5\' 11"  (1.803  m)   SpO2 98%   BMI 23.92 kg/m  Wt Readings from Last 3 Encounters:  02/07/18 171 lb 8 oz (77.8 kg)  11/28/17 167 lb 4 oz (75.9 kg)  10/09/17 168 lb 6 oz (76.4 kg)   BP Readings from Last 3 Encounters:  02/26/18 128/80  02/07/18 120/70  11/28/17 120/78   Guideline developer:  UpToDate (see UpToDate for funding source) Date Released: June 2014  Health Maintenance Due  Topic Date Due  . Janet Berlin  06/17/2015  . INFLUENZA VACCINE  09/13/2017    There are no preventive care reminders to display for this patient.  Lab Results  Component Value Date   TSH 1.26 08/31/2017   Lab Results  Component Value Date   WBC 3.9 (L) 08/31/2017   HGB 13.9 08/31/2017   HCT 41.0 08/31/2017   MCV 92.9 08/31/2017   PLT 244.0 08/31/2017   Lab Results  Component Value Date   NA 141 12/22/2015   K 3.9 12/22/2015   CO2 31 12/22/2015   GLUCOSE 67 (L) 12/22/2015   BUN 11 12/22/2015   CREATININE 1.04 12/22/2015   BILITOT 1.1 12/22/2015   ALKPHOS 27 (L) 12/22/2015   AST 17 12/22/2015   ALT 12 12/22/2015   PROT 7.9 12/22/2015   ALBUMIN 4.7 12/22/2015   CALCIUM 10.1 12/22/2015   GFR 115.16 12/22/2015   Lab Results  Component Value Date   CHOL 169 12/22/2015   Lab Results  Component Value Date   HDL 43.70 12/22/2015   Lab Results  Component Value Date   LDLCALC 101 (H) 12/22/2015   Lab Results  Component Value Date   TRIG 126.0 12/22/2015   Lab Results  Component Value Date   CHOLHDL 4 12/22/2015  Wh     Problem List Items Addressed This Visit      Musculoskeletal and Integument   Folliculitis - Primary   Relevant Orders   Ambulatory referral to Dermatology   Wound culture     Other   Depression with anxiety      No orders of the defined types were placed in this encounter.   Follow-up: Return if symptoms worsen or fail to improve.

## 2018-02-28 ENCOUNTER — Telehealth: Payer: Self-pay

## 2018-02-28 DIAGNOSIS — L739 Follicular disorder, unspecified: Secondary | ICD-10-CM

## 2018-02-28 NOTE — Telephone Encounter (Signed)
Copied from CRM [id number]. Topic: General - Inquiry >> Feb 28, 2018  3:53 PM Baldo Daub L wrote: Reason for CRM:   Pt wants to know if his results are back. Pt can be reached at [phone]

## 2018-03-01 DIAGNOSIS — F411 Generalized anxiety disorder: Secondary | ICD-10-CM | POA: Diagnosis not present

## 2018-03-01 LAB — WOUND CULTURE
MICRO NUMBER: 52799
SPECIMEN QUALITY:: ADEQUATE

## 2018-03-01 NOTE — Telephone Encounter (Signed)
Pt called back and I gave his the update on his culture. I explained to him Dr. Ma HillockKremers orders to leave the bump alone and not pick at it. Pt verbalized understanding. I told him once full results come in he will be notified.

## 2018-03-01 NOTE — Telephone Encounter (Signed)
I left patient a voicemail to return my call. 

## 2018-03-01 NOTE — Telephone Encounter (Signed)
Culture is looking negative.  Since the office visit, the thought has occurred to me to ask the patient to try to leave the bump alone and not pick at it.

## 2018-03-06 NOTE — Telephone Encounter (Signed)
I called and spoke with patient. I made him aware that the culture did not grow anything; patient verbalized understanding.

## 2018-03-06 NOTE — Telephone Encounter (Signed)
Patient calling anxious for the rest of the results. Advised he would get a call as soon as they are finalized.

## 2018-03-06 NOTE — Addendum Note (Signed)
Addended by: Marcell Anger E on: 03/06/2018 03:28 PM   Modules accepted: Orders

## 2018-03-06 NOTE — Telephone Encounter (Signed)
Referral re-entered

## 2018-03-06 NOTE — Telephone Encounter (Signed)
Pt calling back for status of the referral to dermatology.  Pt did not know that was [facility] calling about an appt, and the other referral was denied. Pt would like to go somewhere asap.

## 2018-03-08 DIAGNOSIS — F411 Generalized anxiety disorder: Secondary | ICD-10-CM | POA: Diagnosis not present

## 2018-03-08 NOTE — Telephone Encounter (Signed)
Referral faxed to [facility]. °

## 2018-03-15 DIAGNOSIS — F411 Generalized anxiety disorder: Secondary | ICD-10-CM | POA: Diagnosis not present

## 2018-03-26 DIAGNOSIS — L738 Other specified follicular disorders: Secondary | ICD-10-CM | POA: Diagnosis not present

## 2018-03-26 DIAGNOSIS — L739 Follicular disorder, unspecified: Secondary | ICD-10-CM | POA: Diagnosis not present

## 2018-04-05 DIAGNOSIS — F4010 Social phobia, unspecified: Secondary | ICD-10-CM | POA: Diagnosis not present

## 2018-04-12 DIAGNOSIS — F4010 Social phobia, unspecified: Secondary | ICD-10-CM | POA: Diagnosis not present

## 2018-05-08 DIAGNOSIS — F4010 Social phobia, unspecified: Secondary | ICD-10-CM | POA: Diagnosis not present

## 2018-05-09 DIAGNOSIS — F4010 Social phobia, unspecified: Secondary | ICD-10-CM | POA: Diagnosis not present

## 2018-05-17 DIAGNOSIS — F411 Generalized anxiety disorder: Secondary | ICD-10-CM | POA: Diagnosis not present

## 2018-05-21 DIAGNOSIS — F4310 Post-traumatic stress disorder, unspecified: Secondary | ICD-10-CM | POA: Diagnosis not present

## 2018-06-06 DIAGNOSIS — F4310 Post-traumatic stress disorder, unspecified: Secondary | ICD-10-CM | POA: Diagnosis not present

## 2018-07-04 DIAGNOSIS — F411 Generalized anxiety disorder: Secondary | ICD-10-CM | POA: Diagnosis not present

## 2018-08-10 ENCOUNTER — Emergency Department (HOSPITAL_COMMUNITY)
Admission: EM | Admit: 2018-08-10 | Discharge: 2018-08-10 | Disposition: A | Discharge status: Home / Self Care | Payer: Self-pay | Attending: Emergency Medicine | Admitting: Emergency Medicine

## 2018-08-10 ENCOUNTER — Emergency Department (HOSPITAL_COMMUNITY): Payer: Self-pay

## 2018-08-10 ENCOUNTER — Other Ambulatory Visit: Payer: Self-pay

## 2018-08-10 ENCOUNTER — Encounter (HOSPITAL_COMMUNITY): Payer: Self-pay | Admitting: Emergency Medicine

## 2018-08-10 DIAGNOSIS — W109XXA Fall (on) (from) unspecified stairs and steps, initial encounter: Secondary | ICD-10-CM | POA: Insufficient documentation

## 2018-08-10 DIAGNOSIS — Y939 Activity, unspecified: Secondary | ICD-10-CM | POA: Insufficient documentation

## 2018-08-10 DIAGNOSIS — Y999 Unspecified external cause status: Secondary | ICD-10-CM | POA: Insufficient documentation

## 2018-08-10 DIAGNOSIS — Z79899 Other long term (current) drug therapy: Secondary | ICD-10-CM | POA: Insufficient documentation

## 2018-08-10 DIAGNOSIS — S43014A Anterior dislocation of right humerus, initial encounter: Secondary | ICD-10-CM | POA: Insufficient documentation

## 2018-08-10 DIAGNOSIS — Y929 Unspecified place or not applicable: Secondary | ICD-10-CM | POA: Insufficient documentation

## 2018-08-10 DIAGNOSIS — S43004A Unspecified dislocation of right shoulder joint, initial encounter: Secondary | ICD-10-CM

## 2018-08-10 MED ORDER — PROPOFOL 10 MG/ML IV BOLUS
50.0000 mg | Freq: Once | INTRAVENOUS | Status: AC
  Administered 2018-08-10: 50 mg via INTRAVENOUS
  Filled 2018-08-10: qty 20

## 2018-08-10 NOTE — ED Notes (Signed)
Patient transported to X-ray 

## 2018-08-10 NOTE — ED Triage Notes (Addendum)
Patient c/o right shoulder pain after falling down the stairs. Reports hx sports related injuries to same shoulder. Movement and sensation to right hand. Denies head injury and LOC.

## 2018-08-10 NOTE — ED Notes (Signed)
ED Provider at bedside. 

## 2018-08-10 NOTE — ED Notes (Signed)
Timeout at 2200. MD Vanita Panda and Ortho tech Brimson, respiratory at bedside too. 50 mg propofol administered at 2201, 30 mg propofol at 2204, 20 mg propofol at 2205, 30 mg propofol at 2206, 40 mg propofol at 2209. Right shoulder eduction at 2210. Patient alert and oriented x4. VS WNL.

## 2018-08-10 NOTE — ED Provider Notes (Signed)
[facility]-EMERGENCY DEPT Provider Note   CSN: [id number] Arrival date & time: 08/10/18  2046    History   Chief Complaint Chief Complaint  Patient presents with  . Shoulder Injury    HPI Lance Mann is a 23 y.o. male.     HPI Patient presents after falling on a stair, onto an outstretched arm, feeling sudden onset of pain in the right shoulder. Since that time patient has been unable to move the arm, secondary to severe pain. Patient states that he is otherwise well, though he does have a history of left shoulder dislocation. No medication taken for pain relief and it is worse with motion. Past Medical History:  Diagnosis Date  . Allergy     Patient Active Problem List   Diagnosis Date Noted  . Folliculitis 02/07/2018  . Depression with anxiety 11/28/2017  . Bronchitis 10/09/2017  . Smoking 10/09/2017  . Strain of lumbar region 09/17/2017  . Inclusion cyst 09/17/2017  . Medication side effect 08/31/2017  . Screening examination for STD (sexually transmitted disease) 03/19/2017  . Anxiety 03/19/2017  . STD exposure 07/24/2016  . Eczema 12/22/2015  . Allergic rhinitis 04/08/2014    History reviewed. No pertinent surgical history.      Home Medications    Prior to Admission medications   Medication Sig Start Date End Date Taking? Authorizing Provider  cetirizine (ZYRTEC) 10 MG tablet Take 10 mg by mouth daily.   Yes [provider]  pantoprazole (PROTONIX) 40 MG tablet Take 1 tablet (40 mg total) by mouth daily. Patient not taking: Reported on 08/10/2018 10/09/17   Mliss SaxKremer, William Alfred, MD  venlafaxine [facility](EFFEXOR) 37.5 MG tablet Take one daily for a week and then one twice daily. Patient not taking: Reported on 08/10/2018 11/28/17   Mliss SaxKremer, William Alfred, MD    Family History Family History  Problem Relation Age of Onset  . Hypertension Father     Social History Social History   Tobacco Use  . Smoking status: Never  Smoker  . Smokeless tobacco: Never Used  Substance Use Topics  . Alcohol use: No    Alcohol/week: 0.0 standard drinks  . Drug use: No     Allergies   Patient has no known allergies.   Review of Systems Review of Systems  Constitutional:       Per HPI, otherwise negative  HENT:       Per HPI, otherwise negative  Respiratory:       Per HPI, otherwise negative  Cardiovascular:       Per HPI, otherwise negative  Gastrointestinal: Negative for vomiting.  Endocrine:       Negative aside from HPI  Genitourinary:       Neg aside from HPI   Musculoskeletal:       Per HPI, otherwise negative  Skin: Negative.   Allergic/Immunologic: Negative for immunocompromised state.  Neurological: Negative for weakness and numbness.     Physical Exam Updated Vital Signs BP (!) 149/89   Pulse (!) 44   Temp 98.6 F (37 C) (Oral)   Resp 15   SpO2 99%   Physical Exam Vitals signs and nursing note reviewed.  Constitutional:      General: He is not in acute distress.    Appearance: He is well-developed.  HENT:     Head: Normocephalic and atraumatic.  Eyes:     Conjunctiva/sclera: Conjunctivae normal.  Cardiovascular:     Rate and Rhythm: Normal rate and regular rhythm.  Pulses: Normal pulses.  Pulmonary:     Effort: Pulmonary effort is normal. No respiratory distress.     Breath sounds: No stridor.  Abdominal:     General: There is no distension.  Musculoskeletal:     Right elbow: Normal.      Arms:  Skin:    General: Skin is warm and dry.  Neurological:     Mental Status: He is alert and oriented to person, place, and time.      ED Treatments / Results  Labs (all labs ordered are listed, but only abnormal results are displayed) Labs Reviewed - No data to display  Radiology Dg Shoulder Right  Result Date: 08/10/2018 CLINICAL DATA:  Larey SeatFell down stairs. Right shoulder pain. Initial encounter. EXAM: RIGHT SHOULDER - 2+ VIEW COMPARISON:  None. FINDINGS: Anterior  dislocation humeral head is seen. No fracture identified. IMPRESSION: Anterior shoulder dislocation.  No fracture identified. Electronically Signed   By: Danae OrleansJohn A Stahl M.D.   On: 08/10/2018 21:31   Dg Shoulder Right Portable  Result Date: 08/10/2018 CLINICAL DATA:  Post reduction EXAM: PORTABLE RIGHT SHOULDER COMPARISON:  X-ray from same day FINDINGS: The previously noted dislocation has been reduced. There is no evidence for displaced fracture. There may be some mild widening of the [facility]. There is no convincing evidence of a Hill-Sachs lesion. IMPRESSION: Status post reduction of the previously noted glenohumeral dislocation without evidence of a displaced fracture Electronically Signed   By: Katherine Mantlehristopher  Green M.D.   On: 08/10/2018 22:31    Procedures .Ortho Injury Treatment  Date/Time: 08/10/2018 10:10 PM Performed by: Gerhard MunchLockwood, Zaliah Wissner, MD Authorized by: Gerhard MunchLockwood, Linsey Hirota, MD   Consent:    Consent obtained:  Verbal   Consent given by:  Patient   Risks discussed:  Irreducible dislocation   Alternatives discussed:  No treatment Universal protocol:    Procedure explained and questions answered to patient or proxy's satisfaction: yes     Immediately prior to procedure a time out was called: no     Patient identity confirmed:  Verbally with patientInjury location: shoulder Location details: right shoulder Injury type: dislocation Dislocation type: anterior Hill-Sachs deformity: no Chronicity: new Pre-procedure neurovascular assessment: neurovascularly intact Pre-procedure distal perfusion: normal Pre-procedure neurological function: normal Pre-procedure range of motion: reduced  Anesthesia: Local anesthesia used: no  Patient sedated: Yes. Refer to sedation procedure documentation for details of sedation. Manipulation performed: yes Reduction method: scapular manipulation and traction and counter traction Reduction successful: yes X-ray confirmed reduction: yes Immobilization:  sling Post-procedure neurovascular assessment: post-procedure neurovascularly intact Post-procedure distal perfusion: normal Post-procedure neurological function: normal Post-procedure range of motion: improved Patient tolerance: patient tolerated the procedure well with no immediate complications  .Sedation  Date/Time: 08/10/2018 10:00 PM Performed by: Gerhard MunchLockwood, Ahaan Zobrist, MD Authorized by: Gerhard MunchLockwood, Agustus Mane, MD   Consent:    Consent obtained:  Verbal   Consent given by:  Patient   Risks discussed:  Allergic reaction, dysrhythmia, inadequate sedation, nausea, prolonged hypoxia resulting in organ damage, prolonged sedation necessitating reversal, respiratory compromise necessitating ventilatory assistance and intubation and vomiting   Alternatives discussed:  Analgesia without sedation, anxiolysis and regional anesthesia Universal protocol:    Procedure explained and questions answered to patient or proxy's satisfaction: yes     Relevant documents present and verified: yes     Test results available and properly labeled: yes     Imaging studies available: yes     Required blood products, implants, devices, and special equipment available: yes     Site/side  marked: yes     Immediately prior to procedure a time out was called: yes     Patient identity confirmation method:  Verbally with patient Indications:    Procedure necessitating sedation performed by:  Physician performing sedation Pre-sedation assessment:    Time since last food or drink:  3   ASA classification: class 1 - normal, healthy patient     Neck mobility: normal     Mouth opening:  3 or more finger widths   Thyromental distance:  4 finger widths   Mallampati score:  I - soft palate, uvula, fauces, pillars visible   Pre-sedation assessments completed and reviewed: airway patency, cardiovascular function, hydration status, mental status, nausea/vomiting, pain level, respiratory function and temperature     Pre-sedation  assessment completed:  08/10/2018 10:00 PM Immediate pre-procedure details:    Reassessment: Patient reassessed immediately prior to procedure     Reviewed: vital signs, relevant labs/tests and NPO status     Verified: bag valve mask available, emergency equipment available, intubation equipment available, IV patency confirmed, oxygen available and suction available   Procedure details (see MAR for exact dosages):    Preoxygenation:  Nasal cannula   Sedation:  Propofol   Intra-procedure monitoring:  Blood pressure monitoring, cardiac monitor, continuous pulse oximetry, frequent LOC assessments, frequent vital sign checks and continuous capnometry   Intra-procedure events: none     Total Provider sedation time (minutes):  20 Post-procedure details:    Post-sedation assessment completed:  08/10/2018 10:38 PM   Attendance: Constant attendance by certified staff until patient recovered     Recovery: Patient returned to pre-procedure baseline     Post-sedation assessments completed and reviewed: airway patency, cardiovascular function, hydration status, mental status, nausea/vomiting, pain level, respiratory function and temperature     Patient is stable for discharge or admission: yes     Patient tolerance:  Tolerated well, no immediate complications   (including critical care time)  Medications Ordered in ED Medications  propofol (DIPRIVAN) 10 mg/mL bolus/IV push 50 mg (50 mg Intravenous Given 08/10/18 2159)     Initial Impression / Assessment and Plan / ED Course  I have reviewed the triage vital signs and the nursing notes.  Pertinent labs & imaging results that were available during my care of the patient were reviewed by me and considered in my medical decision making (see chart for details).    After the initial evaluation with concern for shoulder dislocation the patient consented for shoulder reduction, conscious sedation.     10:35 PM Patient awake and alert, shoulder has been  appropriately reduced, no complications.  61 young male presents with shoulder dislocation, has successful reduction, has no other complaints, improved substantially was discharged in stable condition.  Final Clinical Impressions(s) / ED Diagnoses   Final diagnoses:  Dislocation of right shoulder joint, initial encounter     Carmin Muskrat, MD 08/10/18 2238

## 2018-10-29 ENCOUNTER — Other Ambulatory Visit (HOSPITAL_COMMUNITY)
Admission: RE | Admit: 2018-10-29 | Discharge: 2018-10-29 | Disposition: A | Discharge status: Home / Self Care | Payer: 59 | Source: Ambulatory Visit | Attending: Family Medicine | Admitting: Family Medicine

## 2018-10-29 ENCOUNTER — Other Ambulatory Visit: Payer: Self-pay

## 2018-10-29 ENCOUNTER — Encounter: Payer: Self-pay | Admitting: Family Medicine

## 2018-10-29 ENCOUNTER — Ambulatory Visit (INDEPENDENT_AMBULATORY_CARE_PROVIDER_SITE_OTHER): Payer: 59 | Admitting: Family Medicine

## 2018-10-29 ENCOUNTER — Other Ambulatory Visit: Payer: Self-pay | Admitting: Family Medicine

## 2018-10-29 VITALS — BP 128/80 | HR 50 | Ht 71.0 in | Wt 169.5 lb

## 2018-10-29 DIAGNOSIS — R369 Urethral discharge, unspecified: Secondary | ICD-10-CM | POA: Insufficient documentation

## 2018-10-29 MED ORDER — AZITHROMYCIN 1 G PO PACK: 1.0000 g | PACK | Freq: Once | ORAL | 0 refills | Status: AC

## 2018-10-29 MED ORDER — CEFTRIAXONE SODIUM 250 MG IJ SOLR
250.0000 mg | Freq: Once | INTRAMUSCULAR | Status: AC
  Administered 2018-10-29: 250 mg via INTRAMUSCULAR

## 2018-10-29 NOTE — Progress Notes (Signed)
Established Patient Office Visit  Subjective:  Patient ID: Lance Mann, male    DOB: [birth year]  Age: 23 y.o. MRN: [id number]  CC:  Chief Complaint  Patient presents with  . Exposure to STD    HPI Lance Mann presents for treatment and evaluation of a one-week history of green discharge from his penis with mild dysuria.  Denies frequency or urgency.  There are no rashes on his penis.  The symptoms were preceded by a casual sexual encounter with a a known partner.  She is asymptomatic he tells me.  Patient denies fevers chills weight loss or night sweats.  He does not use any protection.  Past Medical History:  Diagnosis Date  . Allergy     History reviewed. No pertinent surgical history.  Family History  Problem Relation Age of Onset  . Hypertension Father     Social History   Socioeconomic History  . Marital status: Single    Spouse name: Not on file  . Number of children: Not on file  . Years of education: Not on file  . Highest education level: Not on file  Occupational History  . Not on file  Social Needs  . Financial resource strain: Not on file  . Food insecurity    Worry: Not on file    Inability: Not on file  . Transportation needs    Medical: Not on file    Non-medical: Not on file  Tobacco Use  . Smoking status: Never Smoker  . Smokeless tobacco: Never Used  Substance and Sexual Activity  . Alcohol use: No    Alcohol/week: 0.0 standard drinks  . Drug use: No  . Sexual activity: Not on file  Lifestyle  . Physical activity    Days per week: Not on file    Minutes per session: Not on file  . Stress: Not on file  Relationships  . Social Musician on phone: Not on file    Gets together: Not on file    Attends religious service: Not on file    Active member of club or organization: Not on file    Attends meetings of clubs or organizations: Not on file    Relationship status: Not on file  . Intimate partner violence    Fear  of current or ex partner: Not on file    Emotionally abused: Not on file    Physically abused: Not on file    Forced sexual activity: Not on file  Other Topics Concern  . Not on file  Social History Narrative  . Not on file    Outpatient Medications Prior to Visit  Medication Sig Dispense Refill  . cetirizine (ZYRTEC) 10 MG tablet Take 10 mg by mouth daily.    . pantoprazole (PROTONIX) 40 MG tablet Take 1 tablet (40 mg total) by mouth daily. (Patient not taking: Reported on 08/10/2018) 30 tablet 3  . venlafaxine (EFFEXOR) 37.5 MG tablet Take one daily for a week and then one twice daily. (Patient not taking: Reported on 08/10/2018) 60 tablet 0   No facility-administered medications prior to visit.     No Known Allergies  ROS Review of Systems  Constitutional: Negative.   HENT: Negative.   Eyes: Negative for pain and redness.  Respiratory: Negative.   Cardiovascular: Negative.   Gastrointestinal: Negative.   Endocrine: Negative for polyphagia and polyuria.  Genitourinary: Positive for discharge and dysuria. Negative for decreased urine volume, frequency, genital sores, penile  pain, penile swelling, scrotal swelling, testicular pain and urgency.  Musculoskeletal: Negative for arthralgias and myalgias.  Skin: Negative for pallor and rash.  Allergic/Immunologic: Negative for immunocompromised state.  Neurological: Negative for speech difficulty and headaches.  Hematological: Does not bruise/bleed easily.  Psychiatric/Behavioral: Negative.       Objective:    Physical Exam  Constitutional: He is oriented to person, place, and time. He appears well-developed and well-nourished. No distress.  HENT:  Head: Normocephalic and atraumatic.  Right Ear: External ear normal.  Left Ear: External ear normal.  Eyes: Conjunctivae are normal. Right eye exhibits no discharge. Left eye exhibits no discharge. No scleral icterus.  Neck: No JVD present. No tracheal deviation present.   Cardiovascular: Regular rhythm.  Pulmonary/Chest: Effort normal. No stridor.  Abdominal: Bowel sounds are normal. Hernia confirmed negative in the right inguinal area and confirmed negative in the left inguinal area.  Genitourinary:    Right testis shows no mass, no swelling and no tenderness. Right testis is descended. Left testis shows no mass, no swelling and no tenderness. Left testis is descended. Circumcised. No hypospadias, penile erythema or penile tenderness. No discharge found.  Lymphadenopathy:       Right: No inguinal adenopathy present.       Left: No inguinal adenopathy present.  Neurological: He is alert and oriented to person, place, and time.  Skin: Skin is warm and dry. No rash noted. He is not diaphoretic. No erythema.  Psychiatric: He has a normal mood and affect. His behavior is normal.    BP 128/80   Pulse (!) 50   Ht 5\' 11"  (1.803 m)   Wt 169 lb 8 oz (76.9 kg)   SpO2 98%   BMI 23.64 kg/m  Wt Readings from Last 3 Encounters:  10/29/18 169 lb 8 oz (76.9 kg)  02/07/18 171 lb 8 oz (77.8 kg)  11/28/17 167 lb 4 oz (75.9 kg)   BP Readings from Last 3 Encounters:  10/29/18 128/80  08/10/18 (!) 149/89  02/26/18 128/80   Guideline developer:  UpToDate (see UpToDate for funding source) Date Released: June 2014  Health Maintenance Due  Topic Date Due  . Janet BerlinETANUS/TDAP  06/17/2015  . INFLUENZA VACCINE  09/14/2018    There are no preventive care reminders to display for this patient.  Lab Results  Component Value Date   TSH 1.26 08/31/2017   Lab Results  Component Value Date   WBC 3.9 (L) 08/31/2017   HGB 13.9 08/31/2017   HCT 41.0 08/31/2017   MCV 92.9 08/31/2017   PLT 244.0 08/31/2017   Lab Results  Component Value Date   NA 141 12/22/2015   K 3.9 12/22/2015   CO2 31 12/22/2015   GLUCOSE 67 (L) 12/22/2015   BUN 11 12/22/2015   CREATININE 1.04 12/22/2015   BILITOT 1.1 12/22/2015   ALKPHOS 27 (L) 12/22/2015   AST 17 12/22/2015   ALT 12  12/22/2015   PROT 7.9 12/22/2015   ALBUMIN 4.7 12/22/2015   CALCIUM 10.1 12/22/2015   GFR 115.16 12/22/2015   Lab Results  Component Value Date   CHOL 169 12/22/2015   Lab Results  Component Value Date   HDL 43.70 12/22/2015   Lab Results  Component Value Date   LDLCALC 101 (H) 12/22/2015   Lab Results  Component Value Date   TRIG 126.0 12/22/2015   Lab Results  Component Value Date   CHOLHDL 4 12/22/2015   No results found for: HGBA1C    Assessment &  Plan:   Problem List Items Addressed This Visit      Other   Discharge from penis - Primary   Relevant Medications   cefTRIAXone (ROCEPHIN) injection 250 mg   azithromycin (ZITHROMAX) 1 g powder   Other Relevant Orders   HIV Antibody (routine testing w rflx)   Urine cytology ancillary only   RPR      Meds ordered this encounter  Medications  . cefTRIAXone (ROCEPHIN) injection 250 mg  . azithromycin (ZITHROMAX) 1 g powder    Sig: Take 1 packet (1 g total) by mouth once for 1 dose.    Dispense:  1 each    Refill:  0    Follow-up: Return if symptoms worsen or fail to improve.    Advised and encouraged patient to engage in safe sex.  Advised him to use a condom the beach partner.  He was given information to discuss this further.  Talked about recently diagnosing patients with HIV and herpes.  He understands that these are not curable diseases.

## 2018-10-29 NOTE — Patient Instructions (Signed)

## 2018-10-30 LAB — RPR: RPR Ser Ql: NONREACTIVE

## 2018-10-30 LAB — HIV ANTIBODY (ROUTINE TESTING W REFLEX): HIV 1&2 Ab, 4th Generation: NONREACTIVE

## 2018-10-31 LAB — URINE CYTOLOGY ANCILLARY ONLY
Chlamydia: POSITIVE — AB
Neisseria Gonorrhea: NEGATIVE
Trichomonas: NEGATIVE

## 2019-02-05 ENCOUNTER — Other Ambulatory Visit: Payer: Self-pay

## 2019-02-27 ENCOUNTER — Other Ambulatory Visit: Payer: Self-pay | Admitting: Cardiology

## 2019-02-27 DIAGNOSIS — Z20822 Contact with and (suspected) exposure to covid-19: Secondary | ICD-10-CM

## 2019-02-28 LAB — NOVEL CORONAVIRUS, NAA: SARS-CoV-2, NAA: NOT DETECTED

## 2019-03-10 ENCOUNTER — Ambulatory Visit: Payer: 59 | Admitting: Family Medicine

## 2019-03-12 ENCOUNTER — Other Ambulatory Visit: Payer: Self-pay

## 2019-03-13 ENCOUNTER — Encounter: Payer: Self-pay | Admitting: Family Medicine

## 2019-03-13 ENCOUNTER — Ambulatory Visit (INDEPENDENT_AMBULATORY_CARE_PROVIDER_SITE_OTHER): Payer: 59 | Admitting: Family Medicine

## 2019-03-13 VITALS — BP 118/70 | HR 57 | Temp 98.1°F | Ht 71.0 in | Wt 171.6 lb

## 2019-03-13 DIAGNOSIS — S46812A Strain of other muscles, fascia and tendons at shoulder and upper arm level, left arm, initial encounter: Secondary | ICD-10-CM | POA: Diagnosis not present

## 2019-03-13 MED ORDER — MELOXICAM 7.5 MG PO TABS: 7.5000 mg | ORAL_TABLET | Freq: Every day | ORAL | 0 refills | Status: DC
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
## 2019-03-13 NOTE — Patient Instructions (Signed)
Muscle Strain A muscle strain is an injury that occurs when a muscle is stretched beyond its normal length. Usually, a small number of muscle fibers are torn when this happens. There are three types of muscle strains. First-degree strains have the least amount of muscle fiber tearing and the least amount of pain. Second-degree and third-degree strains have more tearing and pain. Usually, recovery from muscle strain takes 1-2 weeks. Complete healing normally takes 5-6 weeks. What are the causes? This condition is caused when a sudden, violent force is placed on a muscle and stretches it too far. This may occur with a fall, lifting, or sports. What increases the risk? This condition is more likely to develop in athletes and people who are physically active. What are the signs or symptoms? Symptoms of this condition include:  Pain.  Bruising.  Swelling.  Trouble using the muscle. How is this diagnosed? This condition is diagnosed based on a physical exam and your medical history. Tests may also be done, including an X-ray, ultrasound, or MRI. How is this treated? This condition is initially treated with PRICE therapy. This therapy involves:  Protecting the muscle from being injured again.  Resting the injured muscle.  Icing the injured muscle.  Applying pressure (compression) to the injured muscle. This may be done with a splint or elastic bandage.  Raising (elevating) the injured muscle. Your health care provider may also recommend medicine for pain. Follow these instructions at home: If you have a splint:  Wear the splint as told by your health care provider. Remove it only as told by your health care provider.  Loosen the splint if your fingers or toes tingle, become numb, or turn cold and blue.  Keep the splint clean.  If the splint is not waterproof: ? Do not let it get wet. ? Cover it with a watertight covering when you take a bath or a shower. Managing pain, stiffness,  and swelling   If directed, put ice on the injured area. ? If you have a removable splint, remove it as told by your health care provider. ? Put ice in a plastic bag. ? Place a towel between your skin and the bag. ? Leave the ice on for 20 minutes, 2-3 times a day.  Move your fingers or toes often to avoid stiffness and to lessen swelling.  Raise (elevate) the injured area above the level of your heart while you are sitting or lying down.  Wear an elastic bandage as told by your health care provider. Make sure that it is not too tight. General instructions  Take over-the-counter and prescription medicines only as told by your health care provider.  Restrict your activity and rest the injured muscle as told by your health care provider. Gentle movements may be allowed.  If physical therapy was prescribed, do exercises as told by your health care provider.  Do not put pressure on any part of the splint until it is fully hardened. This may take several hours.  Do not use any products that contain nicotine or tobacco, such as cigarettes and e-cigarettes. These can delay bone healing. If you need help quitting, ask your health care provider.  Ask your health care provider when it is safe to drive if you have a splint.  Keep all follow-up visits as told by your health care provider. This is important. How is this prevented?  Warm up before exercising. This helps to prevent future muscle strains. Contact a health care provider if:    You have more pain or swelling in the injured area. Get help right away if:  You have numbness or tingling or lose a lot of strength in the injured area. Summary  A muscle strain is an injury that occurs when a muscle is stretched beyond its normal length.  This condition is caused when a sudden, violent force is placed on a muscle and stretches it too far.  This condition is initially treated with PRICE therapy, which involves protecting, resting,  icing, compressing, and elevating.  Gentle movements may be allowed. If physical therapy was prescribed, do exercises as told by your health care provider. This information is not intended to replace advice given to you by your health care provider. Make sure you discuss any questions you have with your health care provider. Document Revised: 01/12/2017 Document Reviewed: 03/08/2016 Elsevier Patient Education  2020 [employer]  

## 2019-06-25 ENCOUNTER — Encounter (HOSPITAL_COMMUNITY): Payer: Self-pay | Admitting: Emergency Medicine

## 2019-06-25 ENCOUNTER — Ambulatory Visit (HOSPITAL_COMMUNITY)
Admission: EM | Admit: 2019-06-25 | Discharge: 2019-06-25 | Disposition: A | Discharge status: Home / Self Care | Payer: 59 | Attending: Family Medicine | Admitting: Family Medicine

## 2019-06-25 ENCOUNTER — Other Ambulatory Visit: Payer: Self-pay

## 2019-06-25 DIAGNOSIS — J029 Acute pharyngitis, unspecified: Secondary | ICD-10-CM

## 2019-06-25 DIAGNOSIS — R369 Urethral discharge, unspecified: Secondary | ICD-10-CM

## 2019-06-25 MED ORDER — DOXYCYCLINE HYCLATE 100 MG PO CAPS: 100.0000 mg | ORAL_CAPSULE | Freq: Two times a day (BID) | ORAL | 0 refills | Status: AC

## 2019-06-25 MED ORDER — STERILE WATER FOR INJECTION IJ SOLN
INTRAMUSCULAR | Status: AC
  Filled 2019-06-25: qty 10

## 2019-06-25 MED ORDER — CEFTRIAXONE SODIUM 500 MG IJ SOLR
INTRAMUSCULAR | Status: AC
  Filled 2019-06-25: qty 500

## 2019-06-25 MED ORDER — CEFTRIAXONE SODIUM 500 MG IJ SOLR
500.0000 mg | Freq: Once | INTRAMUSCULAR | Status: AC
  Administered 2019-06-25: 11:00:00 500 mg via INTRAMUSCULAR

## 2019-06-25 NOTE — ED Provider Notes (Signed)
[place]    CSN: [id number] Arrival date & time: 06/25/19  1004      History   Chief Complaint Chief Complaint  Patient presents with  . Sore Throat  . Cough  . SEXUALLY TRANSMITTED DISEASE    HPI Lance Mann is a 24 y.o. male.   Patient is a 24 year old male past medical history of allergies.  He presents today for multiple complaints.  First complaint being mild cough, sore throat for the past few weeks.  His a current everyday smoker but has decreased his smoking significantly over the past few weeks due to cough, sore throat and mucus.  Denies any associated fever, chills, body aches, chest pain, shortness of breath.  Patient also complaining of green, penile discharge.  He has had multiple sex partners over the past week.  Denies any dysuria, hematuria or urinary frequency.  No testicle pain or swelling.  ROS per HPI      Past Medical History:  Diagnosis Date  . Allergy     Patient Active Problem List   Diagnosis Date Noted  . Strain of left trapezius muscle 03/13/2019  . Discharge from penis 10/29/2018  . Folliculitis 08/06/7626  . Depression with anxiety 11/28/2017  . Bronchitis 10/09/2017  . Smoking 10/09/2017  . Strain of lumbar region 09/17/2017  . Inclusion cyst 09/17/2017  . Medication side effect 08/31/2017  . Screening examination for STD (sexually transmitted disease) 03/19/2017  . Anxiety 03/19/2017  . STD exposure 07/24/2016  . Eczema 12/22/2015  . Allergic rhinitis 04/08/2014    History reviewed. No pertinent surgical history.     Home Medications    Prior to Admission medications   Medication Sig Start Date End Date Taking? Authorizing Provider  cetirizine (ZYRTEC) 10 MG tablet Take 10 mg by mouth daily.   Yes [provider]  doxycycline (VIBRAMYCIN) 100 MG capsule Take 1 capsule (100 mg total) by mouth 2 (two) times daily for 7 days. 06/25/19 07/02/19  Loura Halt A, NP  meloxicam (MOBIC) 7.5 MG tablet  Take 1 tablet (7.5 mg total) by mouth daily. For 15 days with food and then as needed. 03/13/19   Libby Maw, MD  pantoprazole (PROTONIX) 40 MG tablet Take 1 tablet (40 mg total) by mouth daily. Patient not taking: Reported on 03/13/2019 10/09/17 06/25/19  Libby Maw, MD  venlafaxine [facility]) 37.5 MG tablet Take one daily for a week and then one twice daily. Patient not taking: Reported on 08/10/2018 11/28/17 06/25/19  Libby Maw, MD    Family History Family History  Problem Relation Age of Onset  . Hypertension Father     Social History Social History   Tobacco Use  . Smoking status: Never Smoker  . Smokeless tobacco: Never Used  Substance Use Topics  . Alcohol use: No    Alcohol/week: 0.0 standard drinks  . Drug use: No     Allergies   Patient has no known allergies.   Review of Systems Review of Systems   Physical Exam Triage Vital Signs ED Triage Vitals  Enc Vitals Group     BP 06/25/19 1038 139/71     Pulse Rate 06/25/19 1037 (!) 57     Resp 06/25/19 1037 18     Temp 06/25/19 1037 98.3 F (36.8 C)     Temp Source 06/25/19 1037 Oral     SpO2 06/25/19 1037 98 %     Weight --      Height --  Head Circumference --      Peak Flow --      Pain Score 06/25/19 1035 5     Pain Loc --      Pain Edu? --      Excl. in GC? --    No data found.  Updated Vital Signs BP 139/71   Pulse (!) 57   Temp 98.3 F (36.8 C) (Oral)   Resp 18   SpO2 98%   Visual Acuity Right Eye Distance:   Left Eye Distance:   Bilateral Distance:    Right Eye Near:   Left Eye Near:    Bilateral Near:     Physical Exam Vitals and nursing note reviewed.  Constitutional:      General: He is not in acute distress.    Appearance: Normal appearance. He is not ill-appearing, toxic-appearing or diaphoretic.  HENT:     Head: Normocephalic and atraumatic.     Nose: Nose normal.  Eyes:     Conjunctiva/sclera: Conjunctivae normal.  Cardiovascular:       Rate and Rhythm: Normal rate and regular rhythm.  Pulmonary:     Effort: Pulmonary effort is normal.     Breath sounds: Normal breath sounds.  Musculoskeletal:        General: Normal range of motion.     Cervical back: Normal range of motion.  Skin:    General: Skin is warm and dry.  Neurological:     Mental Status: He is alert.  Psychiatric:        Mood and Affect: Mood normal.      UC Treatments / Results  Labs (all labs ordered are listed, but only abnormal results are displayed) Labs Reviewed  CYTOLOGY, (ORAL, ANAL, URETHRAL) ANCILLARY ONLY    EKG   Radiology No results found.  Procedures Procedures (including critical care time)  Medications Ordered in UC Medications  cefTRIAXone (ROCEPHIN) injection 500 mg (500 mg Intramuscular Given 06/25/19 1103)    Initial Impression / Assessment and Plan / UC Course  I have reviewed the triage vital signs and the nursing notes.  Pertinent labs & imaging results that were available during my care of the patient were reviewed by me and considered in my medical decision making (see chart for details).     Sore throat Is likely due to allergies.  He has also had some mild mucus production, slight cough. Recommended continue to quit smoking and try doing daily Zyrtec for symptoms. Nothing concerning on exam  Penile discharge Covering for gonorrhea and chlamydia today. Swab sent for testing Final Clinical Impressions(s) / UC Diagnoses   Final diagnoses:  Sore throat  Penile discharge     Discharge Instructions     Treating you for gonorrhea and chlamydia.  Take the medication as prescribed.  Prescription sent to the pharmacy make sure you finish this entire antibiotic prescription. Start on some daily allergy medicine.  You can get this over-the-counter.  Good brand is cetirizine or zyrtec  Continue to not smoke Follow up as needed for continued or worsening symptoms     ED Prescriptions    Medication  Sig Dispense Auth. Provider   doxycycline (VIBRAMYCIN) 100 MG capsule Take 1 capsule (100 mg total) by mouth 2 (two) times daily for 7 days. 14 capsule Shalana Jardin A, NP     PDMP not reviewed this encounter.   Janace Aris, NP 06/25/19 1117

## 2019-06-25 NOTE — Discharge Instructions (Addendum)
Treating you for gonorrhea and chlamydia.  Take the medication as prescribed.  Prescription sent to the pharmacy make sure you finish this entire antibiotic prescription. Start on some daily allergy medicine.  You can get this over-the-counter.  Good brand is cetirizine or zyrtec  Continue to not smoke Follow up as needed for continued or worsening symptoms

## 2019-06-25 NOTE — ED Triage Notes (Signed)
PT reports cough and sore throat for a few weeks. He smokes and decreased smoking significantly over past few weeks. He is coughing up mucus.   Requests STD testing as well, green penile discharge.

## 2019-06-26 LAB — CYTOLOGY, (ORAL, ANAL, URETHRAL) ANCILLARY ONLY
Chlamydia: POSITIVE — AB
Comment: NEGATIVE
Comment: NEGATIVE
Comment: NORMAL
Neisseria Gonorrhea: NEGATIVE
Trichomonas: NEGATIVE

## 2019-08-16 IMAGING — CR RIGHT SHOULDER - 2+ VIEW
2 series · 2 of 2 positions shown · non-contrast
Comparison: None.

CLINICAL DATA: Fell down stairs. Right shoulder pain. Initial
encounter.

EXAM:
RIGHT SHOULDER - 2+ VIEW

[w shoulder external right]
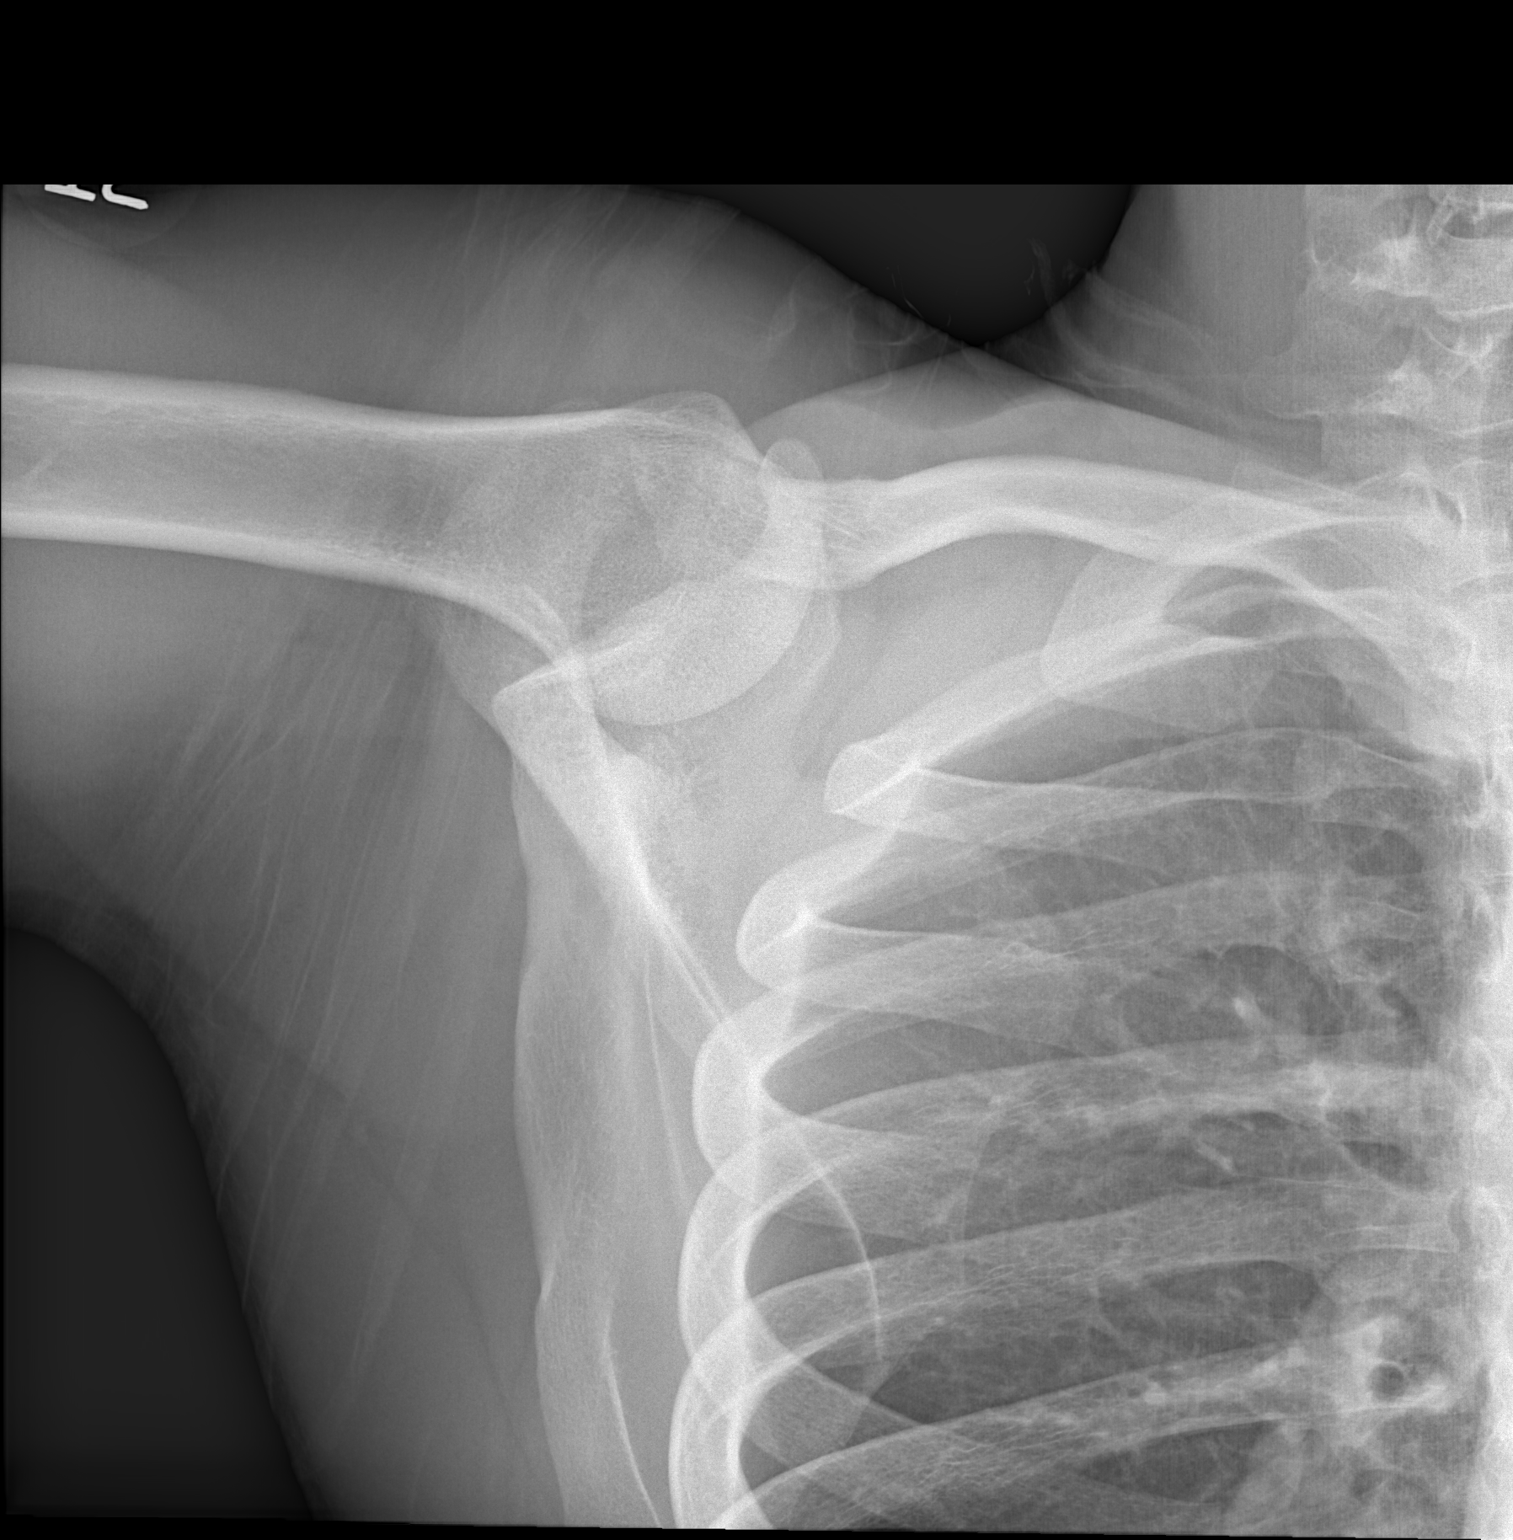

[w shoulder y-view right]
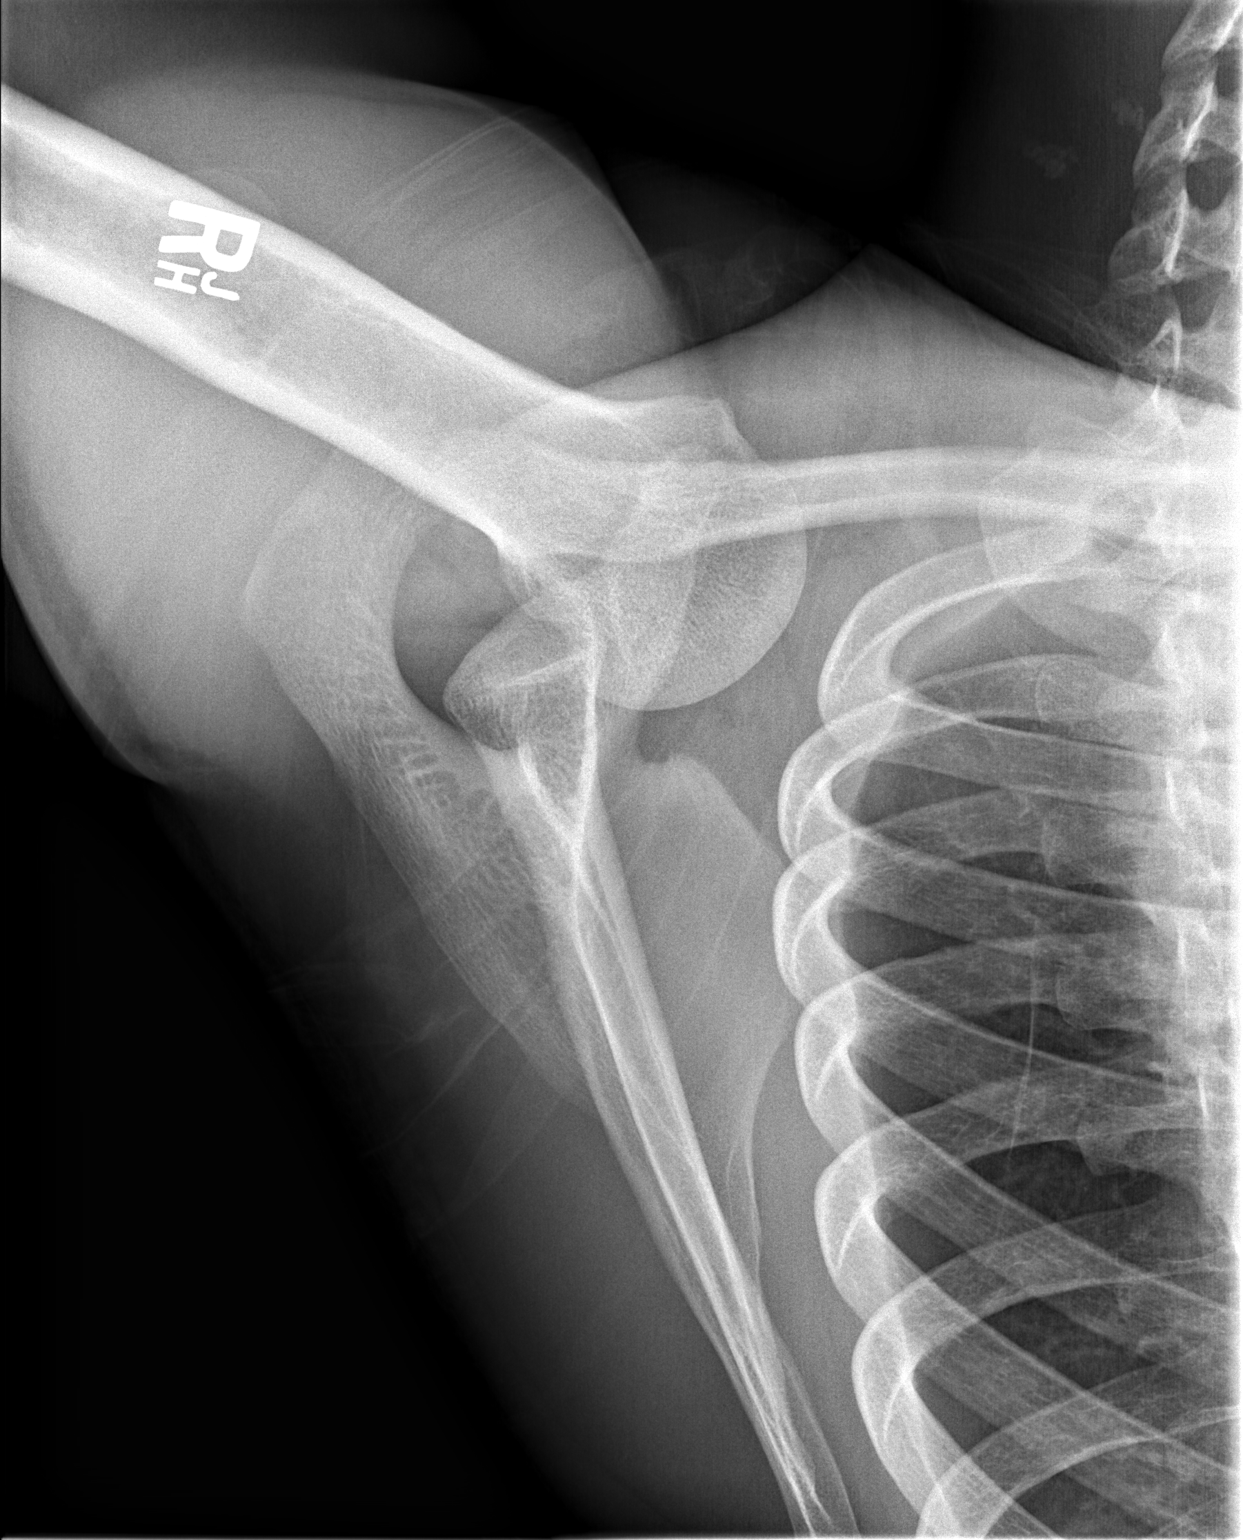

[2 of 2 positions shown; findings below may reference images not displayed]

FINDINGS: Anterior dislocation humeral head is seen. No fracture identified.
IMPRESSION: Anterior shoulder dislocation.  No fracture identified.

## 2019-08-16 IMAGING — DX PORTABLE RIGHT SHOULDER
2 series · 2 of 2 positions shown · non-contrast
Comparison: X-ray from same day

CLINICAL DATA: Post reduction

EXAM:
PORTABLE RIGHT SHOULDER

[shoulder ap]
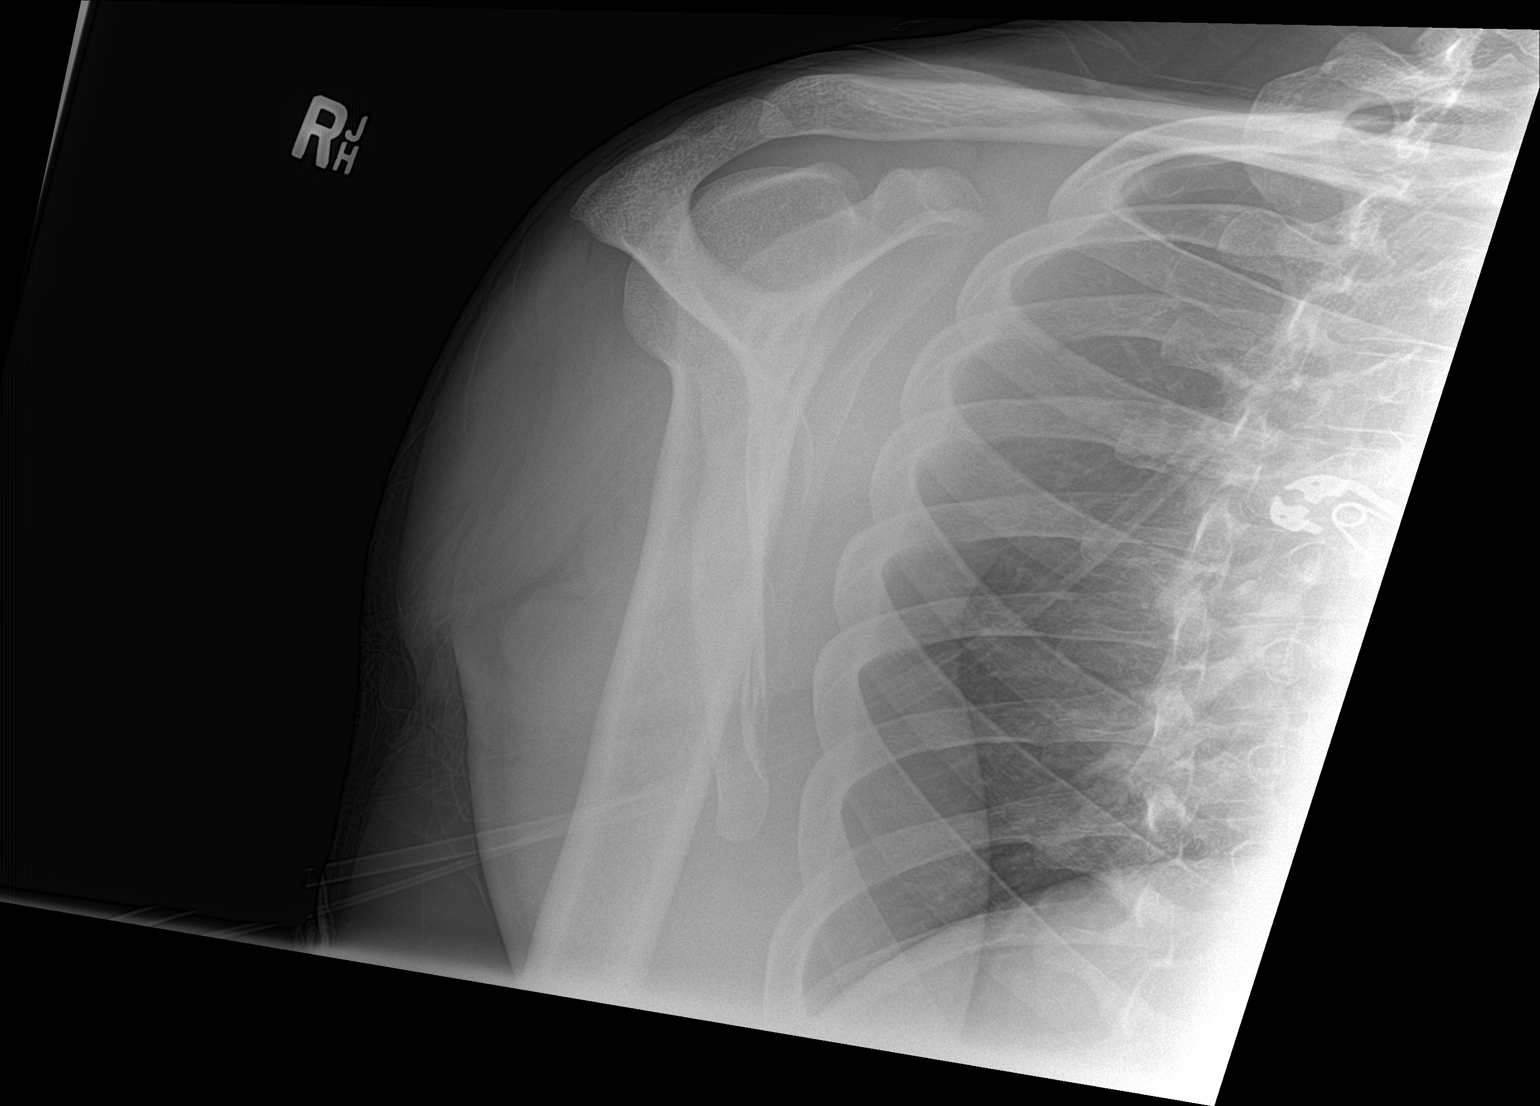

[shoulder obl]
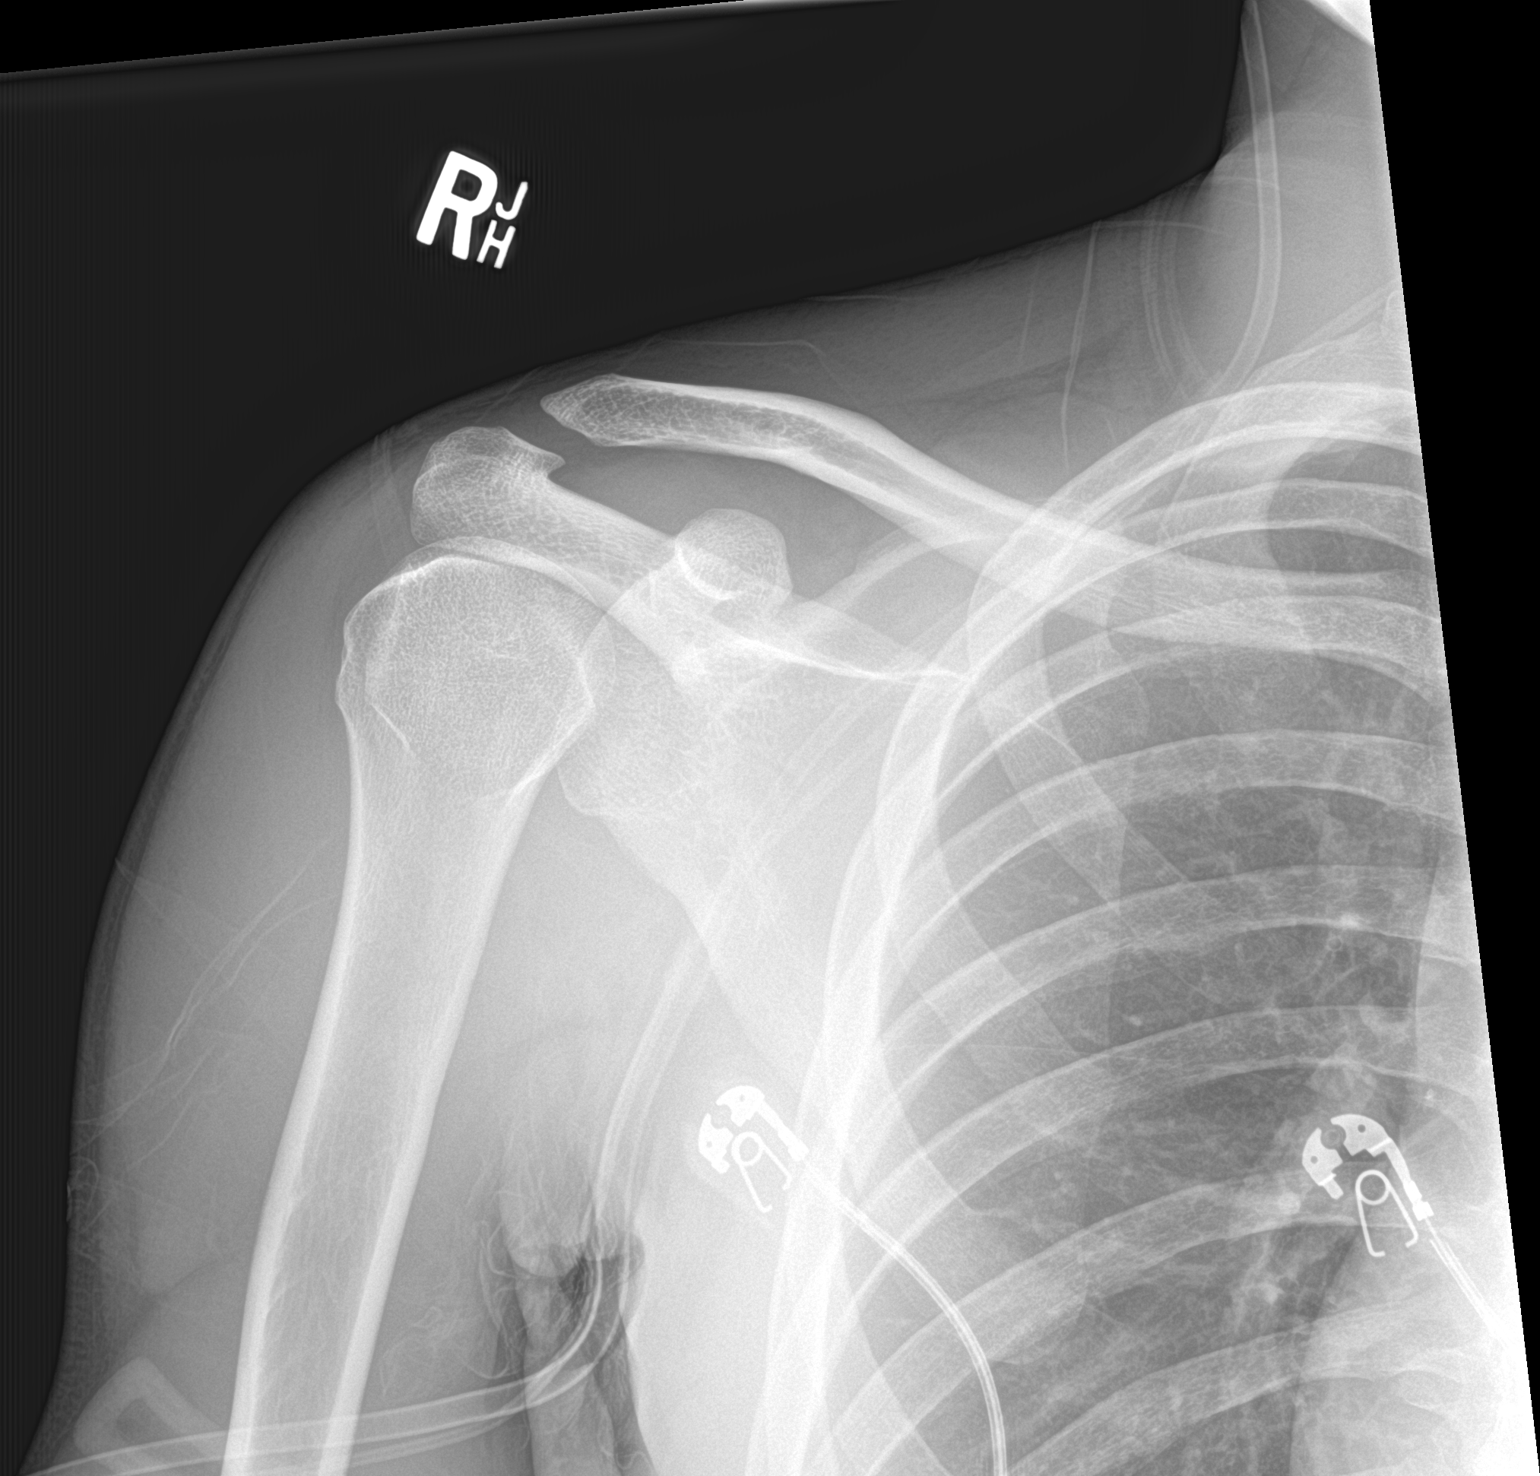

[2 of 2 positions shown; findings below may reference images not displayed]

FINDINGS: The previously noted dislocation has been reduced. There is no
evidence for displaced fracture. There may be some mild widening of
the AC joint. There is no convincing evidence of a Hill-Sachs
lesion.
IMPRESSION: Status post reduction of the previously noted glenohumeral
dislocation without evidence of a displaced fracture

## 2019-08-22 ENCOUNTER — Encounter: Payer: Self-pay | Admitting: Family Medicine

## 2019-08-22 ENCOUNTER — Ambulatory Visit (INDEPENDENT_AMBULATORY_CARE_PROVIDER_SITE_OTHER): Payer: 59 | Admitting: Family Medicine

## 2019-08-22 ENCOUNTER — Other Ambulatory Visit: Payer: Self-pay

## 2019-08-22 ENCOUNTER — Other Ambulatory Visit (HOSPITAL_COMMUNITY)
Admission: RE | Admit: 2019-08-22 | Discharge: 2019-08-22 | Disposition: A | Discharge status: Home / Self Care | Payer: 59 | Source: Ambulatory Visit | Attending: Family Medicine | Admitting: Family Medicine

## 2019-08-22 VITALS — BP 118/76 | HR 66 | Temp 98.5°F | Ht 71.0 in | Wt 167.8 lb

## 2019-08-22 DIAGNOSIS — Z113 Encounter for screening for infections with a predominantly sexual mode of transmission: Secondary | ICD-10-CM

## 2019-08-22 LAB — URINALYSIS, ROUTINE W REFLEX MICROSCOPIC
Bilirubin Urine: NEGATIVE
Hgb urine dipstick: NEGATIVE
Ketones, ur: NEGATIVE
Leukocytes,Ua: NEGATIVE
Nitrite: NEGATIVE
Specific Gravity, Urine: 1.025 (ref 1.000–1.030)
Total Protein, Urine: NEGATIVE
Urine Glucose: NEGATIVE
Urobilinogen, UA: 0.2 (ref 0.0–1.0)
pH: 6 (ref 5.0–8.0)

## 2019-08-22 NOTE — Progress Notes (Signed)
Established Patient Office Visit  Subjective:  Patient ID: Lance Mann, male    DOB: [birth year]  Age: 24 y.o. MRN: [id number]  CC:  Chief Complaint  Patient presents with  . Exposure to STD    patient would like to be checked for STD, no symptoms.     HPI Lance Mann presents for STD check.  No symptoms at this time.  Would like to be checked.  Has not had his Covid vaccine.  Past Medical History:  Diagnosis Date  . Allergy     History reviewed. No pertinent surgical history.  Family History  Problem Relation Age of Onset  . Hypertension Father     Social History   Socioeconomic History  . Marital status: Single    Spouse name: Not on file  . Number of children: Not on file  . Years of education: Not on file  . Highest education level: Not on file  Occupational History  . Not on file  Tobacco Use  . Smoking status: Never Smoker  . Smokeless tobacco: Never Used  Substance and Sexual Activity  . Alcohol use: No    Alcohol/week: 0.0 standard drinks  . Drug use: No  . Sexual activity: Not on file  Other Topics Concern  . Not on file  Social History Narrative  . Not on file   Social Determinants of Health   Financial Resource Strain:   . Difficulty of Paying Living Expenses:   Food Insecurity:   . Worried About Programme researcher, broadcasting/film/video in the Last Year:   . Barista in the Last Year:   Transportation Needs:   . Freight forwarder (Medical):   Marland Kitchen Lack of Transportation (Non-Medical):   Physical Activity:   . Days of Exercise per Week:   . Minutes of Exercise per Session:   Stress:   . Feeling of Stress :   Social Connections:   . Frequency of Communication with Friends and Family:   . Frequency of Social Gatherings with Friends and Family:   . Attends Religious Services:   . Active Member of Clubs or Organizations:   . Attends Banker Meetings:   Marland Kitchen Marital Status:   Intimate Partner Violence:   . Fear of Current or  Ex-Partner:   . Emotionally Abused:   Marland Kitchen Physically Abused:   . Sexually Abused:     Outpatient Medications Prior to Visit  Medication Sig Dispense Refill  . cetirizine (ZYRTEC) 10 MG tablet Take 10 mg by mouth daily.    . meloxicam (MOBIC) 7.5 MG tablet Take 1 tablet (7.5 mg total) by mouth daily. For 15 days with food and then as needed. (Patient not taking: Reported on 08/22/2019) 30 tablet 0   No facility-administered medications prior to visit.    No Known Allergies  ROS Review of Systems  Respiratory: Negative.   Cardiovascular: Negative.   Gastrointestinal: Negative.   Genitourinary: Negative for discharge, dysuria, genital sores, hematuria, penile pain, penile swelling, testicular pain and urgency.  Musculoskeletal: Negative for arthralgias and joint swelling.  Psychiatric/Behavioral: Negative.       Objective:    Physical Exam Constitutional:      General: He is not in acute distress.    Appearance: Normal appearance. He is normal weight. He is not ill-appearing, toxic-appearing or diaphoretic.  HENT:     Head: Normocephalic and atraumatic.     Right Ear: External ear normal.     Left Ear: External  ear normal.  Eyes:     General: No scleral icterus.       Right eye: No discharge.        Left eye: No discharge.     Extraocular Movements: Extraocular movements intact.     Conjunctiva/sclera: Conjunctivae normal.     Pupils: Pupils are equal, round, and reactive to light.  Pulmonary:     Effort: Pulmonary effort is normal.  Abdominal:     Hernia: There is no hernia in the left inguinal area or right inguinal area.  Genitourinary:    Penis: Normal and circumcised. No hypospadias, erythema, tenderness, discharge, swelling or lesions.      Testes:        Right: Mass, tenderness or swelling not present. Right testis is descended.        Left: Mass, tenderness or swelling not present. Left testis is descended.     Epididymis:     Right: Not inflamed or enlarged.      Left: Not inflamed or enlarged.  Lymphadenopathy:     Lower Body: No right inguinal adenopathy. No left inguinal adenopathy.  Skin:    General: Skin is warm and dry.  Neurological:     Mental Status: He is alert and oriented to person, place, and time.  Psychiatric:        Mood and Affect: Mood normal.     BP 118/76   Pulse 66   Temp 98.5 F (36.9 C) (Tympanic)   Ht 5\' 11"  (1.803 m)   Wt 167 lb 12.8 oz (76.1 kg)   SpO2 96%   BMI 23.40 kg/m  Wt Readings from Last 3 Encounters:  08/22/19 167 lb 12.8 oz (76.1 kg)  03/13/19 171 lb 9.6 oz (77.8 kg)  10/29/18 169 lb 8 oz (76.9 kg)     Health Maintenance Due  Topic Date Due  . Hepatitis C Screening  Never done  . COVID-19 Vaccine (1) Never done  . TETANUS/TDAP  06/17/2015    There are no preventive care reminders to display for this patient.  Lab Results  Component Value Date   TSH 1.26 08/31/2017   Lab Results  Component Value Date   WBC 3.9 (L) 08/31/2017   HGB 13.9 08/31/2017   HCT 41.0 08/31/2017   MCV 92.9 08/31/2017   PLT 244.0 08/31/2017   Lab Results  Component Value Date   NA 141 12/22/2015   K 3.9 12/22/2015   CO2 31 12/22/2015   GLUCOSE 67 (L) 12/22/2015   BUN 11 12/22/2015   CREATININE 1.04 12/22/2015   BILITOT 1.1 12/22/2015   ALKPHOS 27 (L) 12/22/2015   AST 17 12/22/2015   ALT 12 12/22/2015   PROT 7.9 12/22/2015   ALBUMIN 4.7 12/22/2015   CALCIUM 10.1 12/22/2015   GFR 115.16 12/22/2015   Lab Results  Component Value Date   CHOL 169 12/22/2015   Lab Results  Component Value Date   HDL 43.70 12/22/2015   Lab Results  Component Value Date   LDLCALC 101 (H) 12/22/2015   Lab Results  Component Value Date   TRIG 126.0 12/22/2015   Lab Results  Component Value Date   CHOLHDL 4 12/22/2015   No results found for: HGBA1C    Assessment & Plan:   Problem List Items Addressed This Visit      Other   Screening examination for STD (sexually transmitted disease) - Primary    Relevant Orders   Urinalysis, Routine w reflex microscopic   Urine cytology  ancillary only   RPR   HIV Antibody (routine testing w rflx)      No orders of the defined types were placed in this encounter.   Follow-up: Return if symptoms worsen or fail to improve.    Mliss Sax, MD

## 2019-08-22 NOTE — Patient Instructions (Signed)

## 2019-08-25 ENCOUNTER — Telehealth: Payer: Self-pay | Admitting: Family Medicine

## 2019-08-25 LAB — URINE CYTOLOGY ANCILLARY ONLY
Chlamydia: NEGATIVE
Comment: NEGATIVE
Comment: NORMAL
Neisseria Gonorrhea: NEGATIVE

## 2019-08-25 LAB — SYPHILIS: RPR W/REFLEX TO RPR TITER AND TREPONEMAL ANTIBODIES, TRADITIONAL SCREENING AND DIAGNOSIS ALGORITHM: RPR Ser Ql: NONREACTIVE

## 2019-08-25 LAB — HIV ANTIBODY (ROUTINE TESTING W REFLEX): HIV 1&2 Ab, 4th Generation: NONREACTIVE

## 2019-08-25 NOTE — Telephone Encounter (Signed)
Patient would like call back with results of STD testing. Stated he has the HIV results but has not received the other test results.

## 2019-08-27 NOTE — Telephone Encounter (Signed)
Patient aware of results.

## 2019-08-29 ENCOUNTER — Telehealth: Payer: Self-pay | Admitting: Family Medicine

## 2019-08-29 DIAGNOSIS — Z202 Contact with and (suspected) exposure to infections with a predominantly sexual mode of transmission: Secondary | ICD-10-CM

## 2019-08-29 MED ORDER — METRONIDAZOLE 500 MG PO TABS: 2000.0000 mg | ORAL_TABLET | Freq: Once | ORAL | 0 refills | Status: AC

## 2019-08-29 NOTE — Addendum Note (Signed)
Addended by: Andrez Grime on: 08/29/2019 04:57 PM   Modules accepted: Orders

## 2019-08-29 NOTE — Telephone Encounter (Signed)
Poke with patient who verbally understood lab results per patient he does sometimes have discharge and also have seen that his sperm has a greenish yellow tint to it. Patient not sure if this discharge or color to sperm is normal. Please advise.

## 2019-08-29 NOTE — Telephone Encounter (Signed)
Patient called again regarding STD results. Stated he does not see chlamydia results and would like a call back about that.

## 2019-09-01 ENCOUNTER — Other Ambulatory Visit: Payer: Self-pay

## 2019-09-01 MED ORDER — METRONIDAZOLE 500 MG PO TABS: ORAL_TABLET | ORAL | 0 refills | Status: DC

## 2019-09-01 NOTE — Telephone Encounter (Signed)
Patient returned call to the office. Notified him of doctors recommendations/instructions regarding medication.

## 2019-12-19 ENCOUNTER — Encounter: Payer: Self-pay | Admitting: Primary Care

## 2019-12-19 ENCOUNTER — Telehealth (INDEPENDENT_AMBULATORY_CARE_PROVIDER_SITE_OTHER): Payer: 59 | Admitting: Primary Care

## 2019-12-19 ENCOUNTER — Other Ambulatory Visit: Payer: Self-pay

## 2019-12-19 VITALS — Temp 98.6°F | Ht 71.0 in | Wt 167.0 lb

## 2019-12-19 DIAGNOSIS — J309 Allergic rhinitis, unspecified: Secondary | ICD-10-CM | POA: Diagnosis not present

## 2019-12-19 MED ORDER — MONTELUKAST SODIUM 10 MG PO TABS: 10.0000 mg | ORAL_TABLET | Freq: Every day | ORAL | 0 refills | Status: DC

## 2019-12-19 NOTE — Assessment & Plan Note (Signed)
Acute on chronic allergy symptoms. Doesn't appear acutely ill. Intolerant to nasal sprays. Will have him continue Zyrtec daily, add in Singulair HS. He will follow up with PCP.

## 2019-12-19 NOTE — Progress Notes (Addendum)
Subjective:    Patient ID: Lance Mann, male    DOB: 10-Jan-1996, 24 y.o.   MRN: [id number]  HPI  Virtual Visit via Video Note  I connected with Lance Mann on 12/19/19 at  7:40 AM EDT by a video enabled telemedicine application and verified that I am speaking with the correct person using two identifiers.  Location: Patient: Home Provider: Office Participants: Patient and myself   I discussed the limitations of evaluation and management by telemedicine and the availability of in person appointments. The patient expressed understanding and agreed to proceed.  History of Present Illness:  Lance Mann is a 24 year old male patient of Dr. Doreene Burke with a history of bronchitis, anxiety, seasonal allergies, tobacco abuse who presents today with a chief complaint of excessive mucous production.  Long history of allergies, constant post nasal drip and mucous production with increased symptoms over the last two weeks since seasonal changes. He is managed on Zyrtec which helps with rhinorrhea, but not with post nasal drip and mucous production. He's tried nasal sprays in the past but could not tolerate.   He denies a history of asthma. Also denies wheezing, shortness of breath, loss of taste/smell, diarrhea, headaches, fevers, sinus pressure. He is not vaccinated for Covid-19, denies known exposure.    Observations/Objective:  Alert and oriented. No cough. No distress. Speaking in complete sentences.   Assessment and Plan:  Acute on chronic allergy symptoms. Doesn't appear acutely ill. Intolerant to nasal sprays. Will have him continue Zyrtec daily, add in Singulair HS. He will follow up with PCP.  Follow Up Instructions:  Start montelukast (Singulair) every night at bedtime for allergies.  Continue Zyrtec daily for allergies.  Please follow up with Dr. Doreene Burke as discussed.  It was a pleasure meeting you! Mayra Reel, NP-C     I discussed the assessment and  treatment plan with the patient. The patient was provided an opportunity to ask questions and all were answered. The patient agreed with the plan and demonstrated an understanding of the instructions.   The patient was advised to call back or seek an in-person evaluation if the symptoms worsen or if the condition fails to improve as anticipated.    Doreene Nest, NP    Review of Systems  Constitutional: Negative for chills, fatigue and fever.  HENT: Positive for congestion, postnasal drip and sneezing. Negative for ear pain, sinus pressure and sore throat.   Respiratory: Positive for cough.   Gastrointestinal: Negative for diarrhea.  Allergic/Immunologic: Positive for environmental allergies.       Past Medical History:  Diagnosis Date  . Allergy      Social History   Socioeconomic History  . Marital status: Single    Spouse name: Not on file  . Number of children: Not on file  . Years of education: Not on file  . Highest education level: Not on file  Occupational History  . Not on file  Tobacco Use  . Smoking status: Never Smoker  . Smokeless tobacco: Never Used  Substance and Sexual Activity  . Alcohol use: No    Alcohol/week: 0.0 standard drinks  . Drug use: No  . Sexual activity: Not on file  Other Topics Concern  . Not on file  Social History Narrative  . Not on file   Social Determinants of Health   Financial Resource Strain:   . Difficulty of Paying Living Expenses: Not on file  Food Insecurity:   . Worried About  Running Out of Food in the Last Year: Not on file  . Ran Out of Food in the Last Year: Not on file  Transportation Needs:   . Lack of Transportation (Medical): Not on file  . Lack of Transportation (Non-Medical): Not on file  Physical Activity:   . Days of Exercise per Week: Not on file  . Minutes of Exercise per Session: Not on file  Stress:   . Feeling of Stress : Not on file  Social Connections:   . Frequency of Communication with  Friends and Family: Not on file  . Frequency of Social Gatherings with Friends and Family: Not on file  . Attends Religious Services: Not on file  . Active Member of Clubs or Organizations: Not on file  . Attends Banker Meetings: Not on file  . Marital Status: Not on file  Intimate Partner Violence:   . Fear of Current or Ex-Partner: Not on file  . Emotionally Abused: Not on file  . Physically Abused: Not on file  . Sexually Abused: Not on file    History reviewed. No pertinent surgical history.  Family History  Problem Relation Age of Onset  . Hypertension Father     No Known Allergies  Current Outpatient Medications on File Prior to Visit  Medication Sig Dispense Refill  . cetirizine (ZYRTEC) 10 MG tablet Take 10 mg by mouth daily.    . cetirizine (ZYRTEC) 10 MG tablet Take by mouth.    . [DISCONTINUED] pantoprazole (PROTONIX) 40 MG tablet Take 1 tablet (40 mg total) by mouth daily. (Patient not taking: Reported on 03/13/2019) 30 tablet 3  . [DISCONTINUED] venlafaxine (EFFEXOR) 37.5 MG tablet Take one daily for a week and then one twice daily. (Patient not taking: Reported on 08/10/2018) 60 tablet 0   No current facility-administered medications on file prior to visit.    Temp 98.6 F (37 C) (Temporal)   Ht 5\' 11"  (1.803 m)   Wt 167 lb (75.8 kg)   BMI 23.29 kg/m    Objective:   Physical Exam Constitutional:      Appearance: He is not ill-appearing.  Pulmonary:     Effort: Pulmonary effort is normal.     Comments: No cough during visit Neurological:     Mental Status: He is alert and oriented to person, place, and time.  Psychiatric:        Mood and Affect: Mood normal.            Assessment & Plan:

## 2019-12-19 NOTE — Patient Instructions (Signed)
Start montelukast (Singulair) every night at bedtime for allergies.  Continue Zyrtec daily for allergies.  Please follow up with Dr. Doreene Burke as discussed.  It was a pleasure meeting you! Mayra Reel, NP-C

## 2020-01-22 ENCOUNTER — Other Ambulatory Visit: Payer: 59

## 2020-01-22 ENCOUNTER — Encounter: Payer: Self-pay | Admitting: Family Medicine

## 2020-01-22 ENCOUNTER — Other Ambulatory Visit: Payer: Self-pay | Admitting: Family Medicine

## 2020-01-22 ENCOUNTER — Telehealth (INDEPENDENT_AMBULATORY_CARE_PROVIDER_SITE_OTHER): Payer: 59 | Admitting: Family Medicine

## 2020-01-22 ENCOUNTER — Other Ambulatory Visit: Payer: Self-pay

## 2020-01-22 DIAGNOSIS — J019 Acute sinusitis, unspecified: Secondary | ICD-10-CM | POA: Insufficient documentation

## 2020-01-22 DIAGNOSIS — J0100 Acute maxillary sinusitis, unspecified: Secondary | ICD-10-CM

## 2020-01-22 MED ORDER — AMOXICILLIN-POT CLAVULANATE 875-125 MG PO TABS: 1.0000 | ORAL_TABLET | Freq: Two times a day (BID) | ORAL | 0 refills | Status: AC

## 2020-01-22 NOTE — Progress Notes (Signed)
Virtual Visit via Video Note  I connected with Lance Mann on 01/22/20 at  9:00 AM EST by a video enabled telemedicine application and verified that I am speaking with the correct person using two identifiers.  Location: Patient: home Provider: office    I discussed the limitations of evaluation and management by telemedicine and the availability of in person appointments. The patient expressed understanding and agreed to proceed.  Parties involved in encounter  Patient: Lance Mann  Provider:  Roxy Manns MD    History of Present Illness: 24 yo pt of Dr Doreene Burke presents with cough  H/o allergies and tob abuse in the past   Has a cough for a around 6 months  He went  Some wheezing (normal for him however)  ? If asthma   Some runny and stuffy nose  Nasal mucous is also green /yellow (occasionally) - at other times it is clear   Fever-none No body aches or chills  Throat- a little scratchy  Ears did hurt/now better  Some facial pain /pressure   No loss of taste or smell  No GI symptoms    Started coughing up a lot of mucous - now green phlegm /yellow   Otc: No medicines   Fluids- drinks a lot  Cannot tolerate nasal sprays (nose bleeds easily)   covid status -not tested  Has never had covid  Not immunized for covid    Takes singulair for allergies as well as zyrtec (saw Mayra Reel)  singulair px is gone now but did not really help   Patient Active Problem List   Diagnosis Date Noted  . Acute sinusitis 01/22/2020  . Strain of left trapezius muscle 03/13/2019  . Discharge from penis 10/29/2018  . Folliculitis 02/07/2018  . Depression with anxiety 11/28/2017  . Bronchitis 10/09/2017  . Smoking 10/09/2017  . Strain of lumbar region 09/17/2017  . Inclusion cyst 09/17/2017  . Medication side effect 08/31/2017  . Screening examination for STD (sexually transmitted disease) 03/19/2017  . Anxiety 03/19/2017  . STD exposure 07/24/2016  . Eczema  12/22/2015  . Allergic rhinitis 04/08/2014   Past Medical History:  Diagnosis Date  . Allergy    No past surgical history on file. Social History   Tobacco Use  . Smoking status: Never Smoker  . Smokeless tobacco: Never Used  Substance Use Topics  . Alcohol use: No    Alcohol/week: 0.0 standard drinks  . Drug use: No   Family History  Problem Relation Age of Onset  . Hypertension Father    No Known Allergies Current Outpatient Medications on File Prior to Visit  Medication Sig Dispense Refill  . cetirizine (ZYRTEC) 10 MG tablet Take 10 mg by mouth daily.    . cetirizine (ZYRTEC) 10 MG tablet Take by mouth.    . [DISCONTINUED] pantoprazole (PROTONIX) 40 MG tablet Take 1 tablet (40 mg total) by mouth daily. (Patient not taking: Reported on 03/13/2019) 30 tablet 3  . [DISCONTINUED] venlafaxine (EFFEXOR) 37.5 MG tablet Take one daily for a week and then one twice daily. (Patient not taking: Reported on 08/10/2018) 60 tablet 0   No current facility-administered medications on file prior to visit.   Review of Systems  Constitutional: Negative for chills, fever and malaise/fatigue.  HENT: Positive for congestion and sinus pain. Negative for ear pain and sore throat.   Eyes: Negative for blurred vision, discharge and redness.  Respiratory: Positive for cough. Negative for shortness of breath and stridor.   Cardiovascular:  Negative for chest pain, palpitations and leg swelling.  Gastrointestinal: Negative for abdominal pain, diarrhea, nausea and vomiting.  Musculoskeletal: Negative for myalgias.  Skin: Negative for rash.  Neurological: Positive for headaches. Negative for dizziness.     Observations/Objective: Patient appears well, in no distress Weight is baseline  No facial swelling or asymmetry Normal voice-not hoarse and no slurred speech No obvious tremor or mobility impairment Moving neck and UEs normally Able to hear the call well  No wheeze or shortness of breath  during interview  Mild cough /junky sounding Talkative and mentally sharp with no cognitive changes No skin changes on face or neck , no rash or pallor Affect is normal    Assessment and Plan: Problem List Items Addressed This Visit      Respiratory   Acute sinusitis    With uri symptoms (for months per pt but recently prod cough and colored phlegm) Cover with augmentin  Fluids/rest  Expectorant with DM prn  Ordered covid swab (pt not immunized)  inst to isolate until result comes back  Update if not starting to improve in a week or if worsening   Watching for sob/inc wheeze (cxr ordered at [facility])  Pending results and will update       Relevant Medications   amoxicillin-clavulanate (AUGMENTIN) 875-125 MG tablet       Follow Up Instructions: Our office will call you to set up a covid swab and also chest xray  Take augmentin as directed  Drink fluids  mucinex DM is helpful for cough and congestion  If symptoms become severe -alert [place] and go to the ER Isolate yourself until covid test returns    I discussed the assessment and treatment plan with the patient. The patient was provided an opportunity to ask questions and all were answered. The patient agreed with the plan and demonstrated an understanding of the instructions.   The patient was advised to call back or seek an in-person evaluation if the symptoms worsen or if the condition fails to improve as anticipated.     Roxy Manns, MD

## 2020-01-22 NOTE — Patient Instructions (Signed)
Our office will call you to set up a covid swab and also chest xray  Take augmentin as directed  Drink fluids  mucinex DM is helpful for cough and congestion  If symptoms become severe -alert [place] and go to the ER Isolate yourself until covid test returns

## 2020-01-22 NOTE — Assessment & Plan Note (Signed)
With uri symptoms (for months per pt but recently prod cough and colored phlegm) Cover with augmentin  Fluids/rest  Expectorant with DM prn  Ordered covid swab (pt not immunized)  inst to isolate until result comes back  Update if not starting to improve in a week or if worsening   Watching for sob/inc wheeze (cxr ordered at [facility])  Pending results and will update

## 2020-01-23 ENCOUNTER — Ambulatory Visit
Admission: RE | Admit: 2020-01-23 | Discharge: 2020-01-23 | Disposition: A | Discharge status: Home / Self Care | Payer: 59 | Source: Ambulatory Visit | Attending: Family Medicine | Admitting: Family Medicine

## 2020-01-23 ENCOUNTER — Ambulatory Visit
Admission: RE | Admit: 2020-01-23 | Discharge: 2020-01-23 | Disposition: A | Discharge status: Home / Self Care | Payer: 59 | Attending: Family Medicine | Admitting: Family Medicine

## 2020-01-23 DIAGNOSIS — J0100 Acute maxillary sinusitis, unspecified: Secondary | ICD-10-CM | POA: Insufficient documentation

## 2020-01-23 LAB — NOVEL CORONAVIRUS, NAA: SARS-CoV-2, NAA: NOT DETECTED

## 2020-01-23 LAB — SPECIMEN STATUS REPORT

## 2020-01-23 LAB — SARS-COV-2, NAA 2 DAY TAT

## 2020-01-27 ENCOUNTER — Telehealth: Payer: Self-pay

## 2020-01-27 NOTE — Telephone Encounter (Signed)
Spoke with pt and advise him due to sxs Dr. Milinda Antis wanted pt to f/u to discuss sxs with PCP's office, and that's why they called and schedule him an in office appt on 01/29/20, pt will keep appt

## 2020-01-27 NOTE — Telephone Encounter (Signed)
Pt left v/m that he spoke with someone yesterday about xray results and pt thought would get cb today after someone spoke with provider. Pt request cb.

## 2020-01-28 ENCOUNTER — Other Ambulatory Visit: Payer: Self-pay

## 2020-01-29 ENCOUNTER — Encounter: Payer: Self-pay | Admitting: Family

## 2020-01-29 ENCOUNTER — Ambulatory Visit (INDEPENDENT_AMBULATORY_CARE_PROVIDER_SITE_OTHER): Payer: 59 | Admitting: Family

## 2020-01-29 VITALS — BP 120/78 | HR 63 | Temp 97.9°F | Wt 176.0 lb

## 2020-01-29 DIAGNOSIS — Z9109 Other allergy status, other than to drugs and biological substances: Secondary | ICD-10-CM | POA: Diagnosis not present

## 2020-01-29 DIAGNOSIS — R0981 Nasal congestion: Secondary | ICD-10-CM

## 2020-01-29 MED ORDER — LEVOCETIRIZINE DIHYDROCHLORIDE 5 MG PO TABS: 5.0000 mg | ORAL_TABLET | Freq: Every evening | ORAL | 5 refills | Status: AC

## 2020-01-29 MED ORDER — PREDNISONE 20 MG PO TABS: 40.0000 mg | ORAL_TABLET | Freq: Every day | ORAL | 0 refills | Status: AC

## 2020-01-29 NOTE — Patient Instructions (Signed)
Allergies, Adult An allergy means that your body reacts to something that bothers it (allergen). It is not a normal reaction. This can happen from something that you:  Eat.  Breathe in.  Touch. You can have an allergy (be allergic) to:  Outdoor things, like: ? Pollen. ? Grass. ? Weeds.  Indoor things, like: ? Dust. ? Smoke. ? Pet dander.  Foods.  Medicines.  Things that bother your skin, like: ? Detergents. ? Chemicals. ? Latex.  Perfume.  Bugs. An allergy cannot spread from person to person (is not contagious). Follow these instructions at home:         Stay away from things that you know you are allergic to.  If you have allergies to things in the air, wash out your nose each day. Do it with one of these: ? A salt-water (saline) spray. ? A container (neti pot).  Take over-the-counter and prescription medicines only as told by your doctor.  Keep all follow-up visits as told by your doctor. This is important.  If you are at risk for a very bad allergy reaction (anaphylaxis), keep an auto-injector with you all the time. This is called an epinephrine injection. ? This is pre-measured medicine with a needle. You can put it into your skin by yourself. ? Right after you have a very bad allergy reaction, you or a person with you must give the medicine in less than a few minutes. This is an emergency.  If you have ever had a very bad allergy reaction, wear a medical alert bracelet or necklace. Your very bad allergy should be written on it. Contact a health care provider if:  Your symptoms do not get better with treatment. Get help right away if:  You have symptoms of a very bad allergy reaction. These include: ? A swollen mouth, tongue, or throat. ? Pain or tightness in your chest. ? Trouble breathing. ? Being short of breath. ? Dizziness. ? Fainting. ? Very bad pain in your belly (abdomen). ? Throwing up (vomiting). ? Watery poop  (diarrhea). Summary  An allergy means that your body reacts to something that bothers it (allergen). It is not a normal reaction.  Stay away from things that make your body react.  Take over-the-counter and prescription medicines only as told by your doctor.  If you are at risk for a very bad allergy reaction, carry an auto-injector (epinephrine injection) all the time. Also, wear a medical alert bracelet or necklace so people know about your allergy. This information is not intended to replace advice given to you by your health care provider. Make sure you discuss any questions you have with your health care provider. Document Revised: 05/21/2018 Document Reviewed: 05/15/2016 Elsevier Patient Education  2020 [employer]  

## 2020-01-29 NOTE — Progress Notes (Signed)
Acute Office Visit  Subjective:    Patient ID: Lance Mann, male    DOB: [birth year], 24 y.o.   MRN: [id number]  Chief Complaint  Patient presents with  . Sinusitis    Sinus issues x 1 year, coughing up thick yellow mucus all the time. Antibiotics not helping much.     HPI Patient is in today with persistent issues with congestion, coughing up a lot pf phelgm x 1 year. He has been on multiple antibiotics, taken Singulair and Zyrtec that help temporarily. He has a known allergy to dust mites from his childhood. Has not had allergy testing in about 15 years. He has central air, no pets, does have carpet in the home.   Past Medical History:  Diagnosis Date  . Allergy     History reviewed. No pertinent surgical history.  Family History  Problem Relation Age of Onset  . Hypertension Father     Social History   Socioeconomic History  . Marital status: Single    Spouse name: Not on file  . Number of children: Not on file  . Years of education: Not on file  . Highest education level: Not on file  Occupational History  . Not on file  Tobacco Use  . Smoking status: Never Smoker  . Smokeless tobacco: Never Used  Substance and Sexual Activity  . Alcohol use: No    Alcohol/week: 0.0 standard drinks  . Drug use: No  . Sexual activity: Not on file  Other Topics Concern  . Not on file  Social History Narrative  . Not on file   Social Determinants of Health   Financial Resource Strain: Not on file  Food Insecurity: Not on file  Transportation Needs: Not on file  Physical Activity: Not on file  Stress: Not on file  Social Connections: Not on file  Intimate Partner Violence: Not on file    Outpatient Medications Prior to Visit  Medication Sig Dispense Refill  . cetirizine (ZYRTEC) 10 MG tablet Take 10 mg by mouth daily.    Marland Kitchen amoxicillin-clavulanate (AUGMENTIN) 875-125 MG tablet Take 1 tablet by mouth 2 (two) times daily. (Patient not taking: Reported on 01/29/2020)  14 tablet 0  . cetirizine (ZYRTEC) 10 MG tablet Take by mouth. (Patient not taking: Reported on 01/29/2020)     No facility-administered medications prior to visit.    No Known Allergies  Review of Systems  Constitutional: Negative.   HENT: Positive for congestion, postnasal drip and sneezing. Negative for sinus pressure and sinus pain.   Respiratory: Positive for cough. Negative for shortness of breath and wheezing.   Musculoskeletal: Negative.   Allergic/Immunologic: Positive for environmental allergies.  Hematological: Negative.   Psychiatric/Behavioral: Negative.        Objective:    Physical Exam Constitutional:      Appearance: Normal appearance. He is normal weight.  HENT:     Head: Normocephalic and atraumatic.     Right Ear: Tympanic membrane, ear canal and external ear normal.     Left Ear: Tympanic membrane, ear canal and external ear normal.     Nose: Congestion and rhinorrhea present.     Mouth/Throat:     Mouth: Mucous membranes are moist.     Pharynx: Oropharynx is clear. Posterior oropharyngeal erythema present.  Cardiovascular:     Rate and Rhythm: Normal rate and regular rhythm.  Pulmonary:     Effort: Pulmonary effort is normal.     Breath sounds: Normal breath sounds.  Musculoskeletal:        General: Normal range of motion.     Cervical back: Normal range of motion and neck supple.  Skin:    General: Skin is warm and dry.  Neurological:     General: No focal deficit present.     Mental Status: He is alert and oriented to person, place, and time.  Psychiatric:        Mood and Affect: Mood normal.     BP 120/78   Pulse 63   Temp 97.9 F (36.6 C) (Temporal)   Wt 176 lb (79.8 kg)   SpO2 98%   BMI 24.55 kg/m  Wt Readings from Last 3 Encounters:  01/29/20 176 lb (79.8 kg)  01/22/20 160 lb (72.6 kg)  12/19/19 167 lb (75.8 kg)    Health Maintenance Due  Topic Date Due  . Hepatitis C Screening  Never done  . COVID-19 Vaccine (1) Never  done  . TETANUS/TDAP  06/17/2015  . INFLUENZA VACCINE  09/14/2019    There are no preventive care reminders to display for this patient.   Lab Results  Component Value Date   TSH 1.26 08/31/2017   Lab Results  Component Value Date   WBC 3.9 (L) 08/31/2017   HGB 13.9 08/31/2017   HCT 41.0 08/31/2017   MCV 92.9 08/31/2017   PLT 244.0 08/31/2017   Lab Results  Component Value Date   NA 141 12/22/2015   K 3.9 12/22/2015   CO2 31 12/22/2015   GLUCOSE 67 (L) 12/22/2015   BUN 11 12/22/2015   CREATININE 1.04 12/22/2015   BILITOT 1.1 12/22/2015   ALKPHOS 27 (L) 12/22/2015   AST 17 12/22/2015   ALT 12 12/22/2015   PROT 7.9 12/22/2015   ALBUMIN 4.7 12/22/2015   CALCIUM 10.1 12/22/2015   GFR 115.16 12/22/2015   Lab Results  Component Value Date   CHOL 169 12/22/2015   Lab Results  Component Value Date   HDL 43.70 12/22/2015   Lab Results  Component Value Date   LDLCALC 101 (H) 12/22/2015   Lab Results  Component Value Date   TRIG 126.0 12/22/2015   Lab Results  Component Value Date   CHOLHDL 4 12/22/2015   No results found for: HGBA1C     Assessment & Plan:   Problem List Items Addressed This Visit   None   Visit Diagnoses    Environmental allergies    -  Primary       Meds ordered this encounter  Medications  . predniSONE (DELTASONE) 20 MG tablet    Sig: Take 2 tablets (40 mg total) by mouth daily with breakfast.    Dispense:  10 tablet    Refill:  0  . levocetirizine (XYZAL) 5 MG tablet    Sig: Take 1 tablet (5 mg total) by mouth every evening.    Dispense:  30 tablet    Refill:  5    Refer to Allergist if symptoms persist.   Eulis Foster, FNP

## 2020-02-12 ENCOUNTER — Telehealth: Payer: 59 | Admitting: Family

## 2021-01-28 IMAGING — CR DG CHEST 2V
2 series · 2 of 2 positions shown · non-contrast
Comparison: None.

CLINICAL DATA: Cough

EXAM:
CHEST - 2 VIEW

[chest pa]
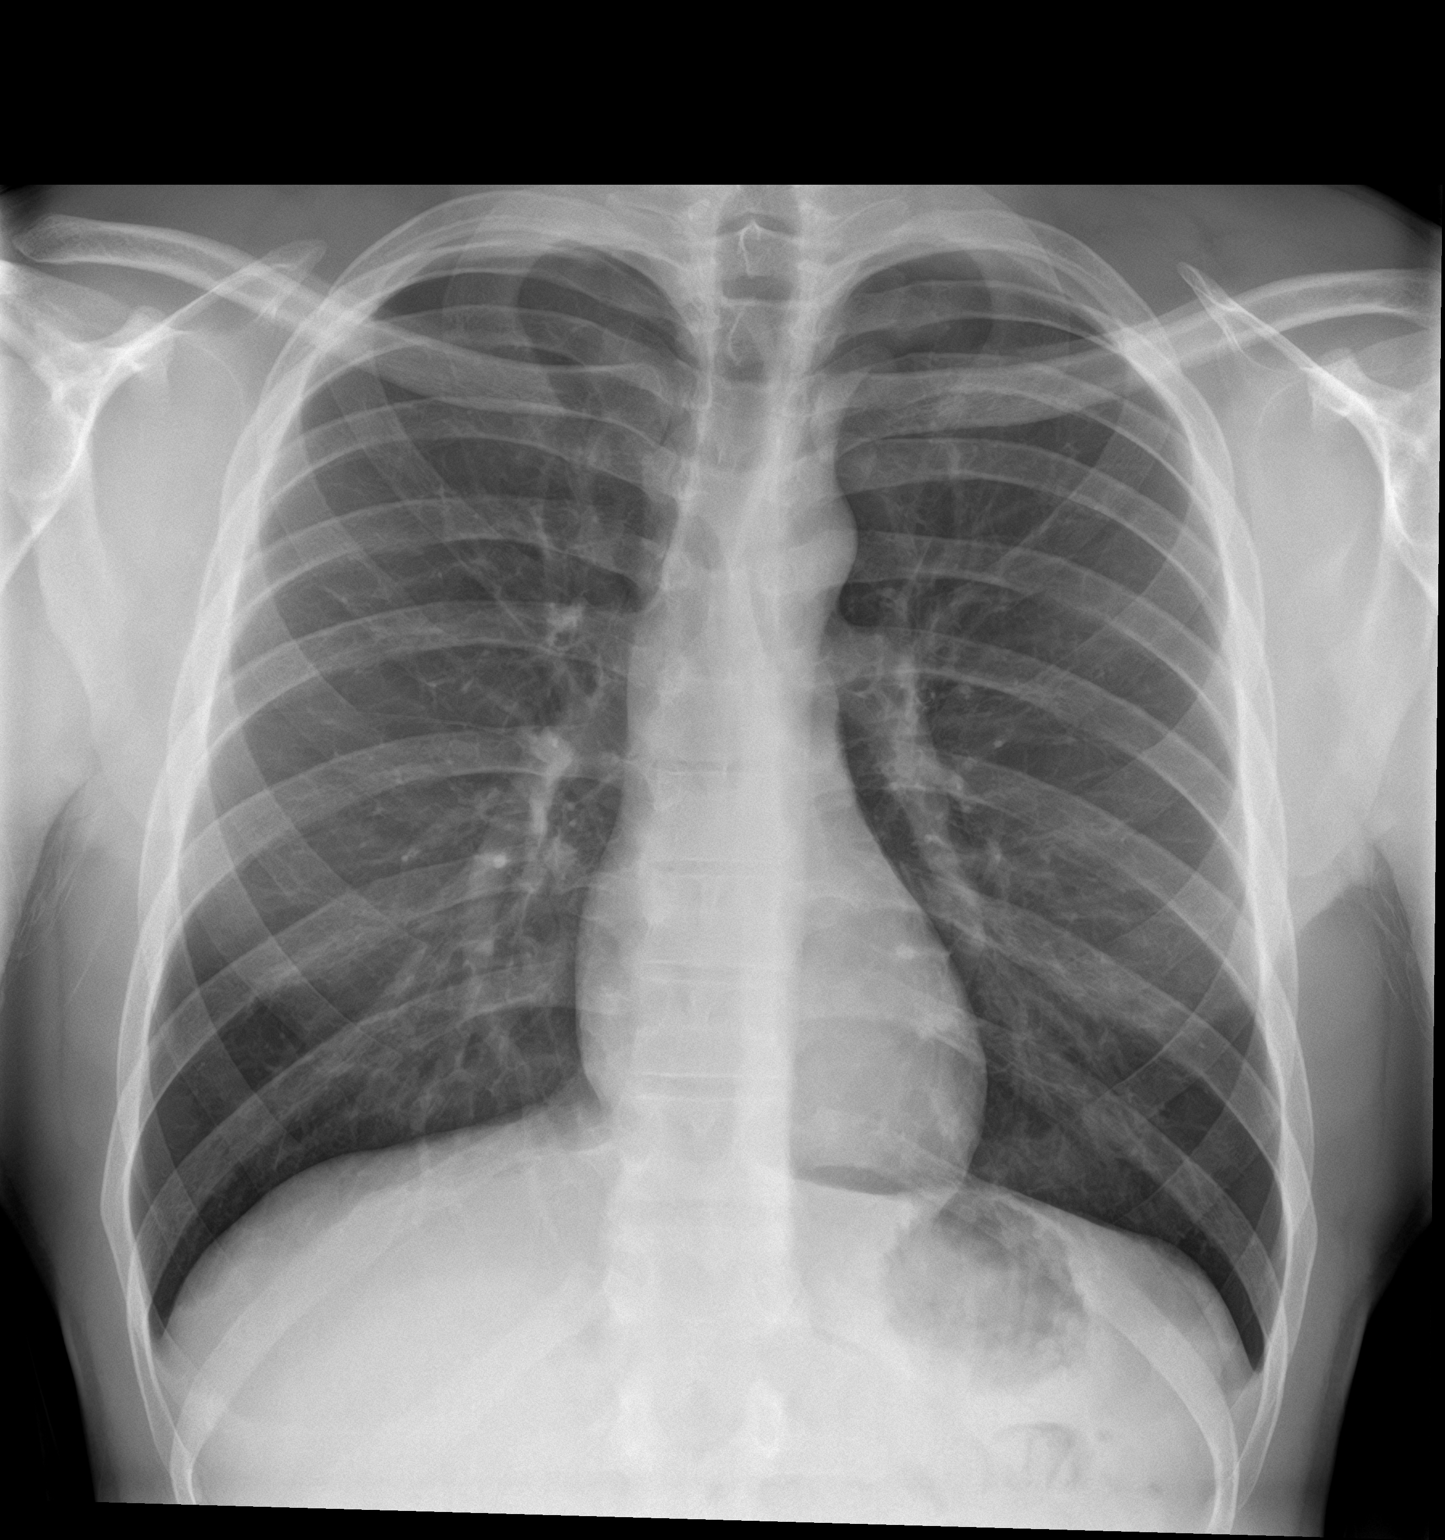

[chest lat]
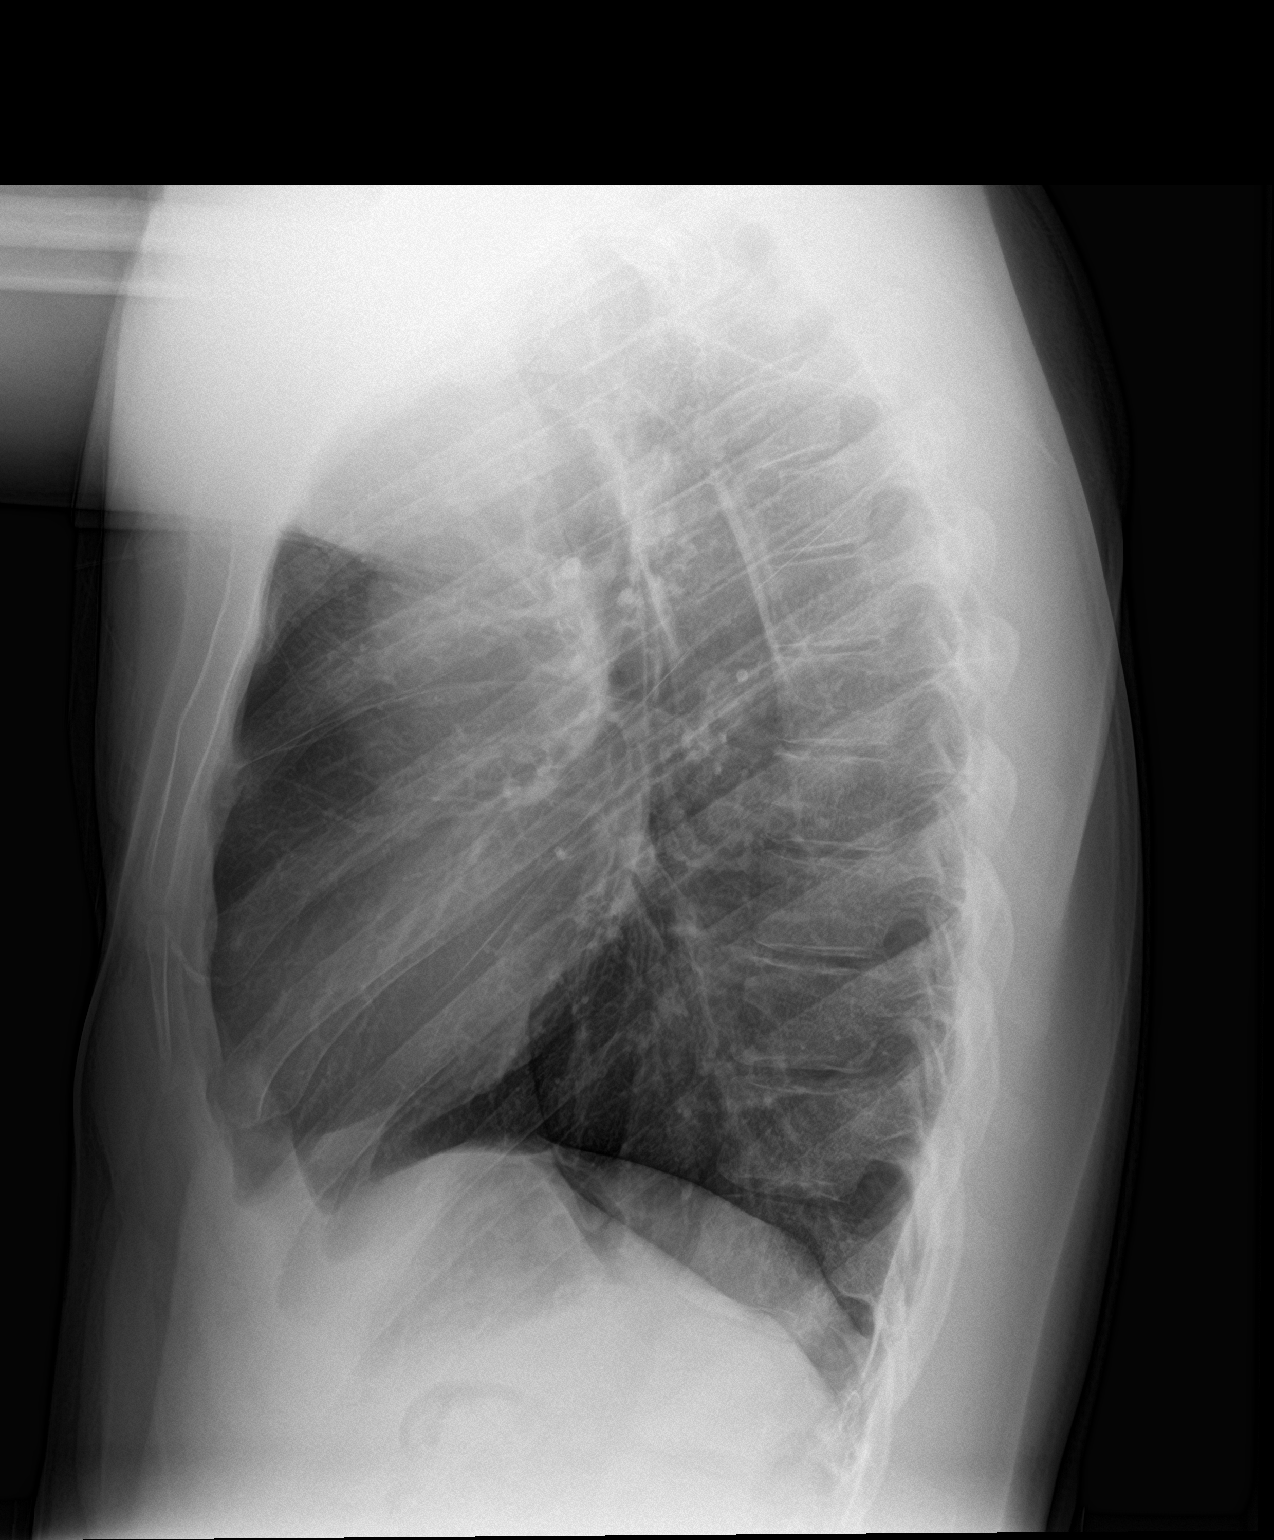

[2 of 2 positions shown; findings below may reference images not displayed]

FINDINGS: The heart size and mediastinal contours are within normal limits.
Both lungs are clear. The visualized skeletal structures are
unremarkable.
IMPRESSION: Normal study.
